# Patient Record
Sex: Male | Born: 1990 | Race: White | Hispanic: No | Marital: Single | Smoking: Former smoker
Health system: Southern US, Community
[De-identification: ages and names within clinical notes are randomized; demographics above are authoritative.]

## PROBLEM LIST (undated history)

## (undated) ENCOUNTER — Ambulatory Visit (HOSPITAL_COMMUNITY)

## (undated) DIAGNOSIS — Z8619 Personal history of other infectious and parasitic diseases: Secondary | ICD-10-CM

## (undated) DIAGNOSIS — Z973 Presence of spectacles and contact lenses: Secondary | ICD-10-CM

## (undated) DIAGNOSIS — G43909 Migraine, unspecified, not intractable, without status migrainosus: Secondary | ICD-10-CM

## (undated) DIAGNOSIS — Z299 Encounter for prophylactic measures, unspecified: Secondary | ICD-10-CM

## (undated) DIAGNOSIS — L309 Dermatitis, unspecified: Secondary | ICD-10-CM

## (undated) HISTORY — DX: Migraine, unspecified, not intractable, without status migrainosus: G43.909

## (undated) HISTORY — PX: HERNIA REPAIR: SHX51

## (undated) HISTORY — DX: Encounter for prophylactic measures, unspecified: Z29.9

---

## 2015-01-26 ENCOUNTER — Encounter: Payer: Self-pay | Admitting: Gastroenterology

## 2015-02-01 ENCOUNTER — Encounter (HOSPITAL_COMMUNITY)
Admission: RE | Admit: 2015-02-01 | Discharge: 2015-02-01 | Disposition: A | Discharge status: Home / Self Care | Payer: Commercial Managed Care - HMO | Source: Ambulatory Visit | Attending: General Surgery | Admitting: General Surgery

## 2015-02-01 ENCOUNTER — Encounter (HOSPITAL_COMMUNITY): Payer: Self-pay

## 2015-02-01 DIAGNOSIS — Z01818 Encounter for other preprocedural examination: Secondary | ICD-10-CM | POA: Insufficient documentation

## 2015-02-01 DIAGNOSIS — B079 Viral wart, unspecified: Secondary | ICD-10-CM | POA: Diagnosis not present

## 2015-02-01 LAB — BASIC METABOLIC PANEL
Anion gap: 9 (ref 5–15)
BUN: 11 mg/dL (ref 6–20)
CHLORIDE: 101 mmol/L (ref 101–111)
CO2: 29 mmol/L (ref 22–32)
CREATININE: 0.81 mg/dL (ref 0.61–1.24)
Calcium: 9.8 mg/dL (ref 8.9–10.3)
GFR calc Af Amer: >60 mL/min (ref >60)
Glucose, Bld: 97 mg/dL (ref 65–99)
Potassium: 4.4 mmol/L (ref 3.5–5.1)
Sodium: 139 mmol/L (ref 135–145)

## 2015-02-01 LAB — CBC
HEMATOCRIT: 43.8 % (ref 39.0–52.0)
HEMOGLOBIN: 15.5 g/dL (ref 13.0–17.0)
MCH: 30.3 pg (ref 26.0–34.0)
MCHC: 35.4 g/dL (ref 30.0–36.0)
MCV: 85.5 fL (ref 78.0–100.0)
Platelets: 185 10*3/uL (ref 150–400)
RBC: 5.12 MIL/uL (ref 4.22–5.81)
RDW: 12.1 % (ref 11.5–15.5)
WBC: 7 10*3/uL (ref 4.0–10.5)

## 2015-02-01 MED ORDER — CHLORHEXIDINE GLUCONATE 4 % EX LIQD: 1.0000 "application " | Freq: Once | CUTANEOUS | Status: DC

## 2015-02-01 NOTE — Patient Instructions (Addendum)
Wesley Joseph  02/01/2015     @   Your procedure is scheduled on Monday, 02/07/15.  Report to [facility] at [phone] A.M.  Call this number if you have problems the morning of surgery:  [phone]   Remember:  Do not eat food or drink liquids after midnight.  Take these medicines the morning of surgery with A SIP OF WATER none   Do not wear jewelry, make-up or nail polish.  Do not wear lotions, powders, or perfumes.  You may wear deodorant.  Do not shave 48 hours prior to surgery.  Men may shave face and neck.  Do not bring valuables to the hospital.  [facility] is not responsible for any belongings or valuables.  Contacts, dentures or bridgework may not be worn into surgery.  Leave your suitcase in the car.  After surgery it may be brought to your room.  For patients admitted to the hospital, discharge time will be determined by your treatment team.  Patients discharged the day of surgery will not be allowed to drive home.   Name and phone number of your driver:   family Special instructions:  Follo0w any special instructions given to you by Dr. Lovell Sheehan or his office.  Please read over the following fact sheets that you were given. Anesthesia Post-op Instructions and Care and Recovery After Surgery      Incision and Drainage Care After Refer to this sheet in the next few weeks. These instructions provide you with information on caring for yourself after your procedure. Your caregiver may also give you more specific instructions. Your treatment has been planned according to current medical practices, but problems sometimes occur. Call your caregiver if you have any problems or questions after your procedure. HOME CARE INSTRUCTIONS   If antibiotic medicine is given, take it as directed. Finish it even if you start to feel better.  Only take over-the-counter or prescription medicines for pain, discomfort, or fever as directed by your caregiver.  Keep all  follow-up appointments as directed by your caregiver.  Change any bandages (dressings) as directed by your caregiver. Replace old dressings with clean dressings.  Wash your hands before and after caring for your wound. You will receive specific instructions for cleansing and caring for your wound.  SEEK MEDICAL CARE IF:   You have increased pain, swelling, or redness around the wound.  You have increased drainage, smell, or bleeding from the wound.  You have muscle aches, chills, or you feel generally sick.  You have a fever. MAKE SURE YOU:   Understand these instructions.  Will watch your condition.  Will get help right away if you are not doing well or get worse. Document Released: 09/24/2011 Document Reviewed: 09/24/2011 [facility] Patient Information 2015 [place], [place]. This information is not intended to replace advice given to you by your health care provider. Make sure you discuss any questions you have with your health care provider. PATIENT INSTRUCTIONS POST-ANESTHESIA  IMMEDIATELY FOLLOWING SURGERY:  Do not drive or operate machinery for the first twenty four hours after surgery.  Do not make any important decisions for twenty four hours after surgery or while taking narcotic pain medications or sedatives.  If you develop intractable nausea and vomiting or a severe headache please notify your doctor immediately.  FOLLOW-UP:  Please make an appointment with your surgeon as instructed. You do not need to follow up with anesthesia unless specifically instructed to do so.  WOUND CARE INSTRUCTIONS (if applicable):  Keep  a dry clean dressing on the anesthesia/puncture wound site if there is drainage.  Once the wound has quit draining you may leave it open to air.  Generally you should leave the bandage intact for twenty four hours unless there is drainage.  If the epidural site drains for more than 36-48 hours please call the anesthesia department.  QUESTIONS?:  Please feel free  to call your physician or the hospital operator if you have any questions, and they will be happy to assist you.

## 2015-02-01 NOTE — H&P (Signed)
  NTS SOAP Note  Vital Signs:  Vitals as of: 02/01/2015: Systolic 127: Diastolic 74: Heart Rate 73: Temp 99.26F: Height 365ft 7in: Weight 129Lbs 0 Ounces: BMI 20.2  BMI : 20.2 kg/m2  Subjective: This 24 year old male presents for of anal growths.  Noticed them several months ago.  Do get irritated with wiping.  Review of Symptoms:  Constitutional:unremarkable   Head:unremarkable Eyes:unremarkable   Nose/Mouth/Throat:unremarkable Cardiovascular:  unremarkable Respiratory:unremarkable Gastrointestinal:  unremarkable   Genitourinary:unremarkable   Musculoskeletal:unremarkable Skin:unremarkable Hematolgic/Lymphatic:unremarkable   Allergic/Immunologic:unremarkable   Past Medical History:  Reviewed  Past Medical History  Surgical History: none Medical Problems: none Allergies: nkda Medications: ezcema cream   Social History:Reviewed  Social History  Preferred Language: English Race:  White Ethnicity: Not Hispanic / Latino Age: 24 year Marital Status:  S Sexual Activity: anal sex Alcohol: socially   Smoking Status: Never smoker reviewed on 02/01/2015 Functional Status reviewed on 02/01/2015 ------------------------------------------------ Bathing: Normal Cooking: Normal Dressing: Normal Driving: Normal Eating: Normal Managing Meds: Normal Oral Care: Normal Shopping: Normal Toileting: Normal Transferring: Normal Walking: Normal Cognitive Status reviewed on 02/01/2015 ------------------------------------------------ Attention: Normal Decision Making: Normal Language: Normal Memory: Normal Motor: Normal Perception: Normal Problem Solving: Normal Visual and Spatial: Normal   Family History:Reviewed  Family Health History Mother, Living; Hypertension (high blood pressure);  Father     Objective Information: General:Well appearing, well nourished in no distress. Heart:RRR, no murmur Lungs:  CTA bilaterally, no wheezes,  rhonchi, rales.  Breathing unlabored. Perianal condylloma present.  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   Patient Education:Alternative treatments to surgery were discussed with patient (and family).  Risks and benefits  of procedure including recurrence of the condylloma were fully explained to the patient (and family) who gave informed consent. Patient/family questions were addressed.  Follow-up:Pending Surgery

## 2015-02-07 ENCOUNTER — Encounter (HOSPITAL_COMMUNITY)
Admission: RE | Disposition: A | Discharge status: Home / Self Care | Payer: Self-pay | Source: Ambulatory Visit | Attending: General Surgery

## 2015-02-07 ENCOUNTER — Ambulatory Visit (HOSPITAL_COMMUNITY)
Admission: RE | Admit: 2015-02-07 | Discharge: 2015-02-07 | Disposition: A | Discharge status: Home / Self Care | Payer: Commercial Managed Care - HMO | Source: Ambulatory Visit | Attending: General Surgery | Admitting: General Surgery

## 2015-02-07 ENCOUNTER — Ambulatory Visit (HOSPITAL_COMMUNITY): Payer: Commercial Managed Care - HMO | Admitting: Anesthesiology

## 2015-02-07 DIAGNOSIS — A630 Anogenital (venereal) warts: Secondary | ICD-10-CM | POA: Diagnosis present

## 2015-02-07 DIAGNOSIS — Z79899 Other long term (current) drug therapy: Secondary | ICD-10-CM | POA: Diagnosis not present

## 2015-02-07 HISTORY — PX: WART FULGURATION: SHX5245

## 2015-02-07 SURGERY — FULGURATION, WART, ANUS
Anesthesia: General | Site: Buttocks

## 2015-02-07 MED ORDER — MIDAZOLAM HCL 2 MG/2ML IJ SOLN
1.0000 mg | INTRAMUSCULAR | Status: DC | PRN
  Administered 2015-02-07: 2 mg via INTRAVENOUS

## 2015-02-07 MED ORDER — BUPIVACAINE HCL (PF) 0.5 % IJ SOLN
INTRAMUSCULAR | Status: AC
  Filled 2015-02-07: qty 30

## 2015-02-07 MED ORDER — HYDROCODONE-ACETAMINOPHEN 5-325 MG PO TABS
1.0000 | ORAL_TABLET | Freq: Once | ORAL | Status: AC
Start: 2015-02-07 — End: 2015-02-07
  Administered 2015-02-07: 1 via ORAL

## 2015-02-07 MED ORDER — FENTANYL CITRATE (PF) 100 MCG/2ML IJ SOLN
INTRAMUSCULAR | Status: AC
  Filled 2015-02-07: qty 2

## 2015-02-07 MED ORDER — KETOROLAC TROMETHAMINE 30 MG/ML IJ SOLN
30.0000 mg | Freq: Once | INTRAMUSCULAR | Status: AC
  Administered 2015-02-07: 30 mg via INTRAVENOUS

## 2015-02-07 MED ORDER — FENTANYL CITRATE (PF) 100 MCG/2ML IJ SOLN: 25.0000 ug | INTRAMUSCULAR | Status: DC | PRN

## 2015-02-07 MED ORDER — MIDAZOLAM HCL 2 MG/2ML IJ SOLN
INTRAMUSCULAR | Status: AC
  Filled 2015-02-07: qty 2

## 2015-02-07 MED ORDER — SILVER SULFADIAZINE 1 % EX CREA
TOPICAL_CREAM | CUTANEOUS | Status: AC
  Filled 2015-02-07: qty 50

## 2015-02-07 MED ORDER — LIDOCAINE VISCOUS 2 % MT SOLN
OROMUCOSAL | Status: AC
  Filled 2015-02-07: qty 15

## 2015-02-07 MED ORDER — HYDROCODONE-ACETAMINOPHEN 5-325 MG PO TABS: 1.0000 | ORAL_TABLET | Freq: Four times a day (QID) | ORAL | Status: AC | PRN

## 2015-02-07 MED ORDER — LIDOCAINE HCL 1 % IJ SOLN
INTRAMUSCULAR | Status: DC | PRN
  Administered 2015-02-07: 30 mg via INTRADERMAL

## 2015-02-07 MED ORDER — ONDANSETRON HCL 4 MG/2ML IJ SOLN
4.0000 mg | Freq: Once | INTRAMUSCULAR | Status: AC
  Administered 2015-02-07: 4 mg via INTRAVENOUS

## 2015-02-07 MED ORDER — HYDROCODONE-ACETAMINOPHEN 5-325 MG PO TABS
ORAL_TABLET | ORAL | Status: AC
  Filled 2015-02-07: qty 1

## 2015-02-07 MED ORDER — MIDAZOLAM HCL 5 MG/5ML IJ SOLN
INTRAMUSCULAR | Status: DC | PRN
  Administered 2015-02-07: 2 mg via INTRAVENOUS

## 2015-02-07 MED ORDER — FENTANYL CITRATE (PF) 100 MCG/2ML IJ SOLN
INTRAMUSCULAR | Status: DC | PRN
  Administered 2015-02-07 (×2): 50 ug via INTRAVENOUS

## 2015-02-07 MED ORDER — FENTANYL CITRATE (PF) 100 MCG/2ML IJ SOLN
25.0000 ug | INTRAMUSCULAR | Status: AC
  Administered 2015-02-07 (×2): 25 ug via INTRAVENOUS

## 2015-02-07 MED ORDER — PROPOFOL 10 MG/ML IV BOLUS
INTRAVENOUS | Status: DC | PRN
  Administered 2015-02-07: 150 mg via INTRAVENOUS
  Administered 2015-02-07: 20 mg via INTRAVENOUS

## 2015-02-07 MED ORDER — ONDANSETRON HCL 4 MG/2ML IJ SOLN
INTRAMUSCULAR | Status: AC
  Filled 2015-02-07: qty 2

## 2015-02-07 MED ORDER — SILVER SULFADIAZINE 1 % EX CREA
TOPICAL_CREAM | CUTANEOUS | Status: DC | PRN
  Administered 2015-02-07: 1 via TOPICAL

## 2015-02-07 MED ORDER — PROPOFOL 10 MG/ML IV BOLUS
INTRAVENOUS | Status: AC
Start: 2015-02-07 — End: 2015-02-07
  Filled 2015-02-07: qty 20

## 2015-02-07 MED ORDER — BUPIVACAINE HCL (PF) 0.5 % IJ SOLN
INTRAMUSCULAR | Status: DC | PRN
  Administered 2015-02-07: 8 mL

## 2015-02-07 MED ORDER — KETOROLAC TROMETHAMINE 30 MG/ML IJ SOLN
INTRAMUSCULAR | Status: AC
Start: 2015-02-07 — End: 2015-02-07
  Filled 2015-02-07: qty 1

## 2015-02-07 MED ORDER — ONDANSETRON HCL 4 MG/2ML IJ SOLN: 4.0000 mg | Freq: Once | INTRAMUSCULAR | Status: AC | PRN

## 2015-02-07 MED ORDER — 0.9 % SODIUM CHLORIDE (POUR BTL) OPTIME
TOPICAL | Status: DC | PRN
  Administered 2015-02-07: 500 mL

## 2015-02-07 MED ORDER — LACTATED RINGERS IV SOLN
INTRAVENOUS | Status: DC
  Administered 2015-02-07: 1000 mL via INTRAVENOUS

## 2015-02-07 SURGICAL SUPPLY — 31 items
BAG HAMPER (MISCELLANEOUS) ×2 IMPLANT
BLADE 15 SAFETY STRL DISP (BLADE) ×2 IMPLANT
CLOTH BEACON ORANGE TIMEOUT ST (SAFETY) ×2 IMPLANT
COVER LIGHT HANDLE STERIS (MISCELLANEOUS) ×4 IMPLANT
DECANTER SPIKE VIAL GLASS SM (MISCELLANEOUS) ×2 IMPLANT
ELECT REM PT RETURN 9FT ADLT (ELECTROSURGICAL) ×2
ELECTRODE REM PT RTRN 9FT ADLT (ELECTROSURGICAL) ×1 IMPLANT
FACESHIELD STD STERILE (MASK) ×2 IMPLANT
FORMALIN 10 PREFIL 120ML (MISCELLANEOUS) ×2 IMPLANT
GAUZE SPONGE 4X4 16PLY XRAY LF (GAUZE/BANDAGES/DRESSINGS) ×2 IMPLANT
GLOVE INDICATOR 7.0 STRL GRN (GLOVE) ×2 IMPLANT
GLOVE SURG SS PI 7.5 STRL IVOR (GLOVE) ×4 IMPLANT
GOWN STRL REUS W/TWL LRG LVL3 (GOWN DISPOSABLE) ×4 IMPLANT
HEMOSTAT SURGICEL 4X8 (HEMOSTASIS) ×2 IMPLANT
KIT ROOM TURNOVER APOR (KITS) ×2 IMPLANT
LASER FIBER DISP (UROLOGICAL SUPPLIES) IMPLANT
LASER FIBER DISP 1000U (UROLOGICAL SUPPLIES) IMPLANT
LIGASURE IMPACT 36 18CM CVD LR (INSTRUMENTS) ×2 IMPLANT
MANIFOLD NEPTUNE II (INSTRUMENTS) ×2 IMPLANT
NEEDLE HYPO 25X1 1.5 SAFETY (NEEDLE) ×2 IMPLANT
PACK PERI GYN (CUSTOM PROCEDURE TRAY) ×2 IMPLANT
PAD ARMBOARD 7.5X6 YLW CONV (MISCELLANEOUS) ×2 IMPLANT
PREFILTER SMOKE EVAC (FILTER) ×2 IMPLANT
SET BASIN LINEN APH (SET/KITS/TRAYS/PACK) ×2 IMPLANT
SUT SILK 0 FSL (SUTURE) ×2 IMPLANT
SYR BULB IRRIGATION 50ML (SYRINGE) ×2 IMPLANT
SYRINGE 10CC LL (SYRINGE) ×2 IMPLANT
TOWEL OR 17X26 4PK STRL BLUE (TOWEL DISPOSABLE) ×2 IMPLANT
TUBING SMOKE EVAC (TUBING) ×2 IMPLANT
WATER STERILE IRR 1000ML POUR (IV SOLUTION) ×2 IMPLANT
YANKAUER SUCT BULB TIP 10FT TU (MISCELLANEOUS) ×2 IMPLANT

## 2015-02-07 NOTE — Anesthesia Postprocedure Evaluation (Signed)
  Anesthesia Post-op Note  Patient: Wesley Joseph  Procedure(s) Performed: Procedure(s): FULGURATION ANAL CONDYLOMA (N/A)  Patient Location: PACU  Anesthesia Type:General  Level of Consciousness: awake, alert , oriented and patient cooperative  Airway and Oxygen Therapy: Patient Spontanous Breathing  Post-op Pain: 2 /10, mild  Post-op Assessment: Post-op Vital signs reviewed, Patient's Cardiovascular Status Stable, Respiratory Function Stable, Patent Airway, No signs of Nausea or vomiting, Adequate PO intake, Pain level controlled and No headache              Post-op Vital Signs: Reviewed and stable  Last Vitals:  Filed Vitals:   02/07/15 0908  BP: 94/68  Pulse:   Temp: 36.7 C  Resp: 15    Complications: No apparent anesthesia complications

## 2015-02-07 NOTE — Discharge Instructions (Signed)
Human Papillomavirus Human papillomavirus (HPV) is the most common sexually transmitted infection (STI) and is highly contagious. HPV infections cause genital warts and cancers to the outlet of the womb (cervix), birth canal (vagina), opening of the birth canal (vulva), and anus. There are over 100 types of HPV. Four types of HPV are responsible for causing 70% of all cervical cancers. Ninety percent of anal cancers and genital warts are caused by HPV. Unless you have wart-like lesions in the throat or genital warts that you can see or feel, HPV usually does not cause symptoms. Therefore, people can be infected for long periods and pass it on to others without knowing it. HPV in pregnancy usually does not cause a problem for the mother or baby. If the mother has genital warts, the baby rarely gets infected. When the HPV infection is found to be pre-cancerous on the cervix, vagina, or vulva, the mother will be followed closely during the pregnancy. Any needed treatment will be done after the baby is born. CAUSES   Having unprotected sex. HPV can be spread by oral, vaginal, or anal sexual activity.  Having several sex partners.  Having a sex partner who has other sex partners.  Having or having had another sexually transmitted infection. SYMPTOMS   More than 90% of people carrying HPV cannot tell anything is wrong.  Wart-like lesions in the throat (from having oral sex).  Warts in the infected skin or mucous membranes.  Genital warts may itch, burn, or bleed.  Genital warts may be painful or bleed during sexual intercourse. DIAGNOSIS   Genital warts are easily seen with the naked eye.  Currently, there is no FDA-approved test to detect HPV in males.  In females, a Pap test can show cells which are infected with HPV.  In females, a scope can be used to view the cervix (colposcopy). A colposcopy can be performed if the pelvic exam or Pap test is abnormal.  In females, a sample of tissue  may be removed (biopsy) during the colposcopy. TREATMENT   Treatment of genital warts can include:  Podophyllin lotion or gel.  Bichloroacetic acid (BCA) or trichloroacetic acid (TCA).  Podofilox solution or gel.  Imiquimod cream.  Interferon injections.  Use of a probe to apply extreme cold (cryotherapy).  Application of an intense beam of light (laser treatment).  Use of a probe to apply extreme heat (electrocautery).  Surgery.  HPV of the cervix, vagina, or vulva can be treated with:  Cryotherapy.  Laser treatment.  Electrocautery.  Surgery. Your caregiver will follow you closely after you are treated. This is because the HPV can come back and may need treatment again. HOME CARE INSTRUCTIONS   Follow your caregiver's instructions regarding medications, Pap tests, and follow-up exams.  Do not touch or scratch the warts.  Do not treat genital warts with medication used for treating hand warts.  Tell your sex partner about your infection because he or she may also need treatment.  Do not have sex while you are being treated.  After treatment, use condoms during sex to prevent future infections.  Have only 1 sex partner.  Have a sex partner who does not have other sex partners.  Use over-the-counter creams for itching or irritation as directed by your caregiver.  Use over-the-counter or prescription medicines for pain, discomfort, or fever as directed by your caregiver.  Do not douche or use tampons during treatment of HPV. PREVENTION   Talk to your caregiver about getting the HPV   vaccines. These vaccines prevent some HPV infections and cancers. It is recommended that the vaccine be given to males and females between the age of 9 and 26 years old. It will not work if you already have HPV and it is not recommended for pregnant women. The vaccines are not recommended for pregnant women.  Call your caregiver if you think you are pregnant and have the HPV.  A  PAP test is done to screen for cervical cancer.  The first PAP test should be done at age 21.  Between ages 21 and 29, PAP tests are repeated every 2 years.  Beginning at age 30, you are advised to have a PAP test every 3 years as long as your past 3 PAP tests have been normal.  Some women have medical problems that increase the chance of getting cervical cancer. Talk to your caregiver about these problems. It is especially important to talk to your caregiver if a new problem develops soon after your last PAP test. In these cases, your caregiver may recommend more frequent screening and Pap tests.  The above recommendations are the same for women who have or have not gotten the vaccine for HPV (Human Papillomavirus).  If you had a hysterectomy for a problem that was not a cancer or a condition that could lead to cancer, then you no longer need Pap tests. However, even if you no longer need a PAP test, a regular exam is a good idea to make sure no other problems are starting.   If you are between ages 65 and 70, and you have had normal Pap tests going back 10 years, you no longer need Pap tests. However, even if you no longer need a PAP test, a regular exam is a good idea to make sure no other problems are starting.  If you have had past treatment for cervical cancer or a condition that could lead to cancer, you need Pap tests and screening for cancer for at least 20 years after your treatment.  If Pap tests have been discontinued, risk factors (such as a new sexual partner)need to be re-assessed to determine if screening should be resumed.  Some women may need screenings more often if they are at high risk for cervical cancer. SEEK MEDICAL CARE IF:   The treated skin becomes red, swollen or painful.  You have an oral temperature above 102 F (38.9 C).  You feel generally ill.  You feel lumps or pimple-like projections in and around your genital area.  You develop bleeding of the  vagina or the treatment area.  You develop painful sexual intercourse. Document Released: 09/22/2003 Document Revised: 09/24/2011 Document Reviewed: 10/07/2013 [employer] Patient Information 2015 [employer]. This information is not intended to replace advice given to you by your health care provider. Make sure you discuss any questions you have with your health care provider.  

## 2015-02-07 NOTE — Op Note (Signed)
Patient:  Wesley Joseph  DOB:  1991-06-06  MRN:  [id number]   Preop Diagnosis:  Anal condyloma  Postop Diagnosis:  Same  Procedure:  Excision and fulguration of anal condyloma  Surgeon:  Franky Macho, M.D.  Anes:  Gen.  Indications:  Patient is a 24 year old white male who presents with anal condyloma. The risks and benefits of the procedure including bleeding, infection, and recurrence of the condyloma were fully explained to the patient, who gave informed consent.  Procedure note:  The patient was placed in the lithotomy position after general anesthesia was administered. The perineum was prepped and draped using the usual sterile technique with Betadine. Surgical site confirmation was performed.  The patient had multiple condyloma present both externally and a small amount in the anal canal. A large one was removed using the LigaSure. The rest were fulgurated using cautery. 0.5% Sensorcaine was instilled the surrounding perineum. Surgicel and Viscous Xylocaine rectal packing was then placed.  All tape and needle counts were correct at the end of the procedure. The patient was awakened and transferred to PACU in stable condition.  Complications:  None  EBL:  Minimal  Specimen:  Anal condyloma

## 2015-02-07 NOTE — Anesthesia Preprocedure Evaluation (Signed)
Anesthesia Evaluation  Patient identified by MRN, date of birth, ID band Patient awake    Reviewed: Allergy & Precautions, NPO status , Patient's Chart, lab work & pertinent test results  Airway Mallampati: II  TM Distance: >3 FB Neck ROM: Full    Dental  (+) Teeth Intact   Pulmonary neg pulmonary ROS,  breath sounds clear to auscultation        Cardiovascular negative cardio ROS  Rhythm:Regular Rate:Normal     Neuro/Psych negative psych ROS   GI/Hepatic negative GI ROS,   Endo/Other    Renal/GU      Musculoskeletal   Abdominal   Peds  Hematology   Anesthesia Other Findings   Reproductive/Obstetrics                             Anesthesia Physical Anesthesia Plan  ASA: I  Anesthesia Plan: General   Post-op Pain Management:    Induction: Intravenous  Airway Management Planned: LMA  Additional Equipment:   Intra-op Plan:   Post-operative Plan: Extubation in OR  Informed Consent: I have reviewed the patients History and Physical, chart, labs and discussed the procedure including the risks, benefits and alternatives for the proposed anesthesia with the patient or authorized representative who has indicated his/her understanding and acceptance.     Plan Discussed with:   Anesthesia Plan Comments:         Anesthesia Quick Evaluation

## 2015-02-07 NOTE — Anesthesia Procedure Notes (Signed)
Procedure Name: LMA Insertion Date/Time: 02/07/2015 8:18 AM Performed by: Despina Hidden Pre-anesthesia Checklist: Patient identified, Emergency Drugs available, Suction available and Patient being monitored Patient Re-evaluated:Patient Re-evaluated prior to inductionOxygen Delivery Method: Circle system utilized Preoxygenation: Pre-oxygenation with 100% oxygen Intubation Type: IV induction Ventilation: Mask ventilation without difficulty LMA: LMA flexible inserted LMA Size: 4.0 Grade View: Grade II Tube type: Oral Number of attempts: 1 Placement Confirmation: positive ETCO2 and breath sounds checked- equal and bilateral Tube secured with: Tape Dental Injury: Teeth and Oropharynx as per pre-operative assessment

## 2015-02-07 NOTE — Interval H&P Note (Signed)
History and Physical Interval Note:  02/07/2015 8:03 AM  Wesley Joseph  has presented today for surgery, with the diagnosis of anal wart  The various methods of treatment have been discussed with the patient and family. After consideration of risks, benefits and other options for treatment, the patient has consented to  Procedure(s): FULGURATION ANAL CONDYLOMA (N/A) as a surgical intervention .  The patient's history has been reviewed, patient examined, no change in status, stable for surgery.  I have reviewed the patient's chart and labs.  Questions were answered to the patient's satisfaction.     Franky Macho A

## 2015-02-07 NOTE — Progress Notes (Signed)
Awake. C/O postop rectal pain.  Saltines and graham crackers given to eat before norco tablet given. Tolerated well.

## 2015-02-07 NOTE — Transfer of Care (Signed)
Immediate Anesthesia Transfer of Care Note  Patient: Wesley Joseph  Procedure(s) Performed: Procedure(s): FULGURATION ANAL CONDYLOMA (N/A)  Patient Location: PACU  Anesthesia Type:General  Level of Consciousness: awake and patient cooperative  Airway & Oxygen Therapy: Patient Spontanous Breathing and Patient connected to face mask oxygen  Post-op Assessment: Report given to RN, Post -op Vital signs reviewed and stable and Patient moving all extremities  Post vital signs: Reviewed and stable  Last Vitals:  Filed Vitals:   02/07/15 0730  BP: 122/77  Pulse:   Temp:   Resp: 14    Complications: No apparent anesthesia complications

## 2015-02-08 ENCOUNTER — Encounter (HOSPITAL_COMMUNITY): Payer: Self-pay | Admitting: General Surgery

## 2015-04-05 ENCOUNTER — Ambulatory Visit: Payer: Self-pay | Admitting: Gastroenterology

## 2015-04-12 ENCOUNTER — Ambulatory Visit: Payer: Self-pay | Admitting: Gastroenterology

## 2016-12-17 ENCOUNTER — Ambulatory Visit (INDEPENDENT_AMBULATORY_CARE_PROVIDER_SITE_OTHER): Payer: Commercial Managed Care - HMO | Admitting: Medical

## 2016-12-17 ENCOUNTER — Encounter: Payer: Self-pay | Admitting: Medical

## 2016-12-17 VITALS — BP 128/72 | HR 79 | Ht 66.5 in | Wt 139.4 lb

## 2016-12-17 DIAGNOSIS — Z113 Encounter for screening for infections with a predominantly sexual mode of transmission: Secondary | ICD-10-CM

## 2016-12-17 DIAGNOSIS — Z7251 High risk heterosexual behavior: Secondary | ICD-10-CM

## 2016-12-17 DIAGNOSIS — Z299 Encounter for prophylactic measures, unspecified: Secondary | ICD-10-CM

## 2016-12-17 DIAGNOSIS — Z23 Encounter for immunization: Secondary | ICD-10-CM

## 2016-12-17 NOTE — Addendum Note (Signed)
Addended by: Jac CanavanYSINGER, Brecklyn Galvis S on: 12/17/2016 08:52 PM   Modules accepted: Level of Service

## 2016-12-17 NOTE — Progress Notes (Signed)
Subjective:  Wesley Joseph is a 26 y.o. male who presents as a new patient.   Was [facility] [place]  Was having difficulty getting in for appt.     He is interested in PreP medication.  He is a gay male.  recently had unprotected sex but found out his partner wasn't been as faithful as him.    He does note hx/o Herpes and prior anal warts that were excised.   His last STD testing was 48mo ago.   Works in [employer].   He has no current symptoms.  He does desire HPV vaccination as well.  No other aggravating or relieving factors.    No other c/o.  The following portions of the patient's history were reviewed and updated as appropriate: allergies, current medications, past family history, past medical history, past social history, past surgical history and problem list.  ROS Otherwise as in subjective above    Objective: BP 128/72   Pulse 79   Ht 5' 6.5" (1.689 m)   Wt 139 lb 6.4 oz (63.2 kg)   SpO2 97%   BMI 22.16 kg/m   General appearance: alert, no distress, WD/WN Neck: supple, no lymphadenopathy, no thyromegaly, no masses Heart: RRR, normal S1, S2, no murmurs Lungs: CTA bilaterally, no wheezes, rhonchi, or rales Pulses: 2+ radial pulses, 2+ pedal pulses, normal cap refill Ext: no edema GU: normal male external genitalia, circumcised, nontender, no masses, no hernia, no lymphadenopathy Rectal: deferred    Assessment: Encounter Diagnoses  Name Primary?  . Prophylactic measure Yes  . High risk sexual behavior   . Screen for STD (sexually transmitted disease)   . Need for HPV vaccination      Plan: Discussed their concern for PreP/Truvada prophylaxis medication.   Counseled on safety risks and indication for PreP, counseled on importance of f/u and regular STD screening q2-4350mo, counseled on discontinuing Truvada if they seroconvert to HIV, counseled on importance of daily dosing, counseled on Truvada as a part of a comprehensive prevention  strategy, discussed safe sex, condom use.   Gave handouts on the medication.  Gave handout and drug information.  They signed the PreP agreement form and voiced understanding and agreement with plan.   Counseled on the Human Papilloma virus vaccine.  Vaccine information sheet given.  HPV vaccine given after consent obtained.  Patient was advised to return in 2 months for HPV #2, and in 6 months for HPV #3.    Reviewed his [facility] vaccine records.    Wesley Joseph was seen today for new patient (initial visit).  Diagnoses and all orders for this visit:  Prophylactic measure -     HIV antibody -     GC/Chlamydia Probe Amp -     RPR -     Hepatitis C antibody -     Hepatitis B surface antigen -     Cancel: Hepatitis B surface antibody  High risk sexual behavior -     HIV antibody -     GC/Chlamydia Probe Amp -     RPR -     Hepatitis C antibody -     Hepatitis B surface antigen -     Cancel: Hepatitis B surface antibody  Screen for STD (sexually transmitted disease) -     HIV antibody -     GC/Chlamydia Probe Amp -     RPR -     Hepatitis C antibody -     Hepatitis B surface  antigen -     Cancel: Hepatitis B surface antibody  Need for HPV vaccination -     HPV 9-valent vaccine,Recombinat

## 2016-12-18 ENCOUNTER — Other Ambulatory Visit: Payer: Self-pay | Admitting: Medical

## 2016-12-18 LAB — HEPATITIS C ANTIBODY: HCV Ab: NEGATIVE

## 2016-12-18 LAB — GC/CHLAMYDIA PROBE AMP
CT PROBE, AMP APTIMA: NOT DETECTED
GC Probe RNA: NOT DETECTED

## 2016-12-18 LAB — HIV ANTIBODY (ROUTINE TESTING W REFLEX): HIV: NONREACTIVE

## 2016-12-18 LAB — RPR

## 2016-12-18 LAB — HEPATITIS B SURFACE ANTIGEN: Hepatitis B Surface Ag: NEGATIVE

## 2016-12-18 MED ORDER — EMTRICITABINE-TENOFOVIR DF 200-300 MG PO TABS: 1.0000 | ORAL_TABLET | Freq: Every day | ORAL | 0 refills | Status: DC

## 2016-12-24 ENCOUNTER — Telehealth: Payer: Self-pay

## 2016-12-24 NOTE — Telephone Encounter (Signed)
Records placed in your folder from [facility] for review. Trixie Rude/RLB

## 2017-01-07 ENCOUNTER — Encounter: Payer: Self-pay | Admitting: Medical

## 2017-03-20 ENCOUNTER — Encounter: Payer: Self-pay | Admitting: Medical

## 2017-03-20 ENCOUNTER — Ambulatory Visit (INDEPENDENT_AMBULATORY_CARE_PROVIDER_SITE_OTHER): Payer: 59 | Admitting: Medical

## 2017-03-20 VITALS — BP 110/72 | HR 59 | Wt 143.6 lb

## 2017-03-20 DIAGNOSIS — Z23 Encounter for immunization: Secondary | ICD-10-CM

## 2017-03-20 DIAGNOSIS — Z299 Encounter for prophylactic measures, unspecified: Secondary | ICD-10-CM

## 2017-03-20 DIAGNOSIS — Z7251 High risk heterosexual behavior: Secondary | ICD-10-CM | POA: Diagnosis not present

## 2017-03-20 DIAGNOSIS — Z113 Encounter for screening for infections with a predominantly sexual mode of transmission: Secondary | ICD-10-CM | POA: Diagnosis not present

## 2017-03-20 NOTE — Addendum Note (Signed)
Addended by: Jac CanavanYSINGER, DAVID S on: 03/20/2017 02:48 PM   Modules accepted: Orders

## 2017-03-20 NOTE — Addendum Note (Signed)
Addended by: Winn JockVALENTINE, Hawkin Charo N on: 03/20/2017 11:52 AM   Modules accepted: Orders

## 2017-03-20 NOTE — Progress Notes (Signed)
  Subjective:  Wesley Joseph is a 26 y.o. male who presents for f/u on Truvada PreP started 12/2016 at his new patient visit.   Has missed 2-3 doses max, but otherwise good about taking it daily.   No side effects reported.   Insurance covered the medications, $50 copay.  Using condoms most of the time, has had new sexual contacts since last visit.   No current symptoms.   [facility] in [place].  Was having difficulty getting in for appt.     He does note hx/o Herpes on serum testing but no prior exposure, and prior anal warts that were excised.  No other aggravating or relieving factors.    Exercising some.  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  No other c/o.  The following portions of the patient's history were reviewed and updated as appropriate: allergies, current medications, past family history, past medical history, past social history, past surgical history and problem list.  ROS Otherwise as in subjective above    Objective: BP 110/72   Pulse (!) 59   Wt 143 lb 9.6 oz (65.1 kg)   SpO2 99%   BMI 22.83 kg/m   General appearance: alert, no distress, WD/WN   Assessment: Encounter Diagnoses  Name Primary?  . Prophylactic measure Yes  . High risk sexual behavior   . Screen for STD (sexually transmitted disease)   . Need for HPV vaccination   . Need for influenza vaccination       Plan: Discussed their concern for PreP/Truvada prophylaxis medication.   Counseled on safety risks and indication for PreP, counseled on importance of f/u and regular STD screening q3620mo, counseled on discontinuing Truvada if they seroconvert to HIV, counseled on importance of daily dosing, counseled on Truvada as a part of a comprehensive prevention strategy, discussed safe sex, condom use.   Routine labs today, c/t PreP.  Counseled on the Human Papilloma virus vaccine.  Vaccine information sheet given.  HPV vaccine given after consent obtained.  Patient was advised to return 4  months for HPV #3.    Counseled on the influenza virus vaccine.  Vaccine information sheet given.  Influenza vaccine given after consent obtained.   Wesley Joseph was seen today for follow-up.  Diagnoses and all orders for this visit:  Prophylactic measure -     RPR -     HIV antibody -     GC/Chlamydia Probe Amp  High risk sexual behavior -     RPR -     HIV antibody -     GC/Chlamydia Probe Amp  Screen for STD (sexually transmitted disease) -     RPR -     HIV antibody -     GC/Chlamydia Probe Amp  Need for HPV vaccination  Need for influenza vaccination

## 2017-03-21 ENCOUNTER — Other Ambulatory Visit: Payer: Self-pay | Admitting: Medical

## 2017-03-21 LAB — C. TRACHOMATIS/N. GONORRHOEAE RNA
C. trachomatis RNA, TMA: NOT DETECTED
N. GONORRHOEAE RNA, TMA: NOT DETECTED

## 2017-03-21 LAB — RPR: RPR Ser Ql: NONREACTIVE

## 2017-03-21 LAB — HIV ANTIBODY (ROUTINE TESTING W REFLEX): HIV 1&2 Ab, 4th Generation: NONREACTIVE

## 2017-03-21 MED ORDER — EMTRICITABINE-TENOFOVIR DF 200-300 MG PO TABS: 1.0000 | ORAL_TABLET | Freq: Every day | ORAL | 0 refills | Status: DC

## 2017-06-12 ENCOUNTER — Ambulatory Visit: Payer: 59 | Admitting: Medical

## 2017-06-12 ENCOUNTER — Encounter: Payer: Self-pay | Admitting: Medical

## 2017-06-12 VITALS — BP 122/80 | HR 65 | Wt 152.0 lb

## 2017-06-12 DIAGNOSIS — Z299 Encounter for prophylactic measures, unspecified: Secondary | ICD-10-CM | POA: Diagnosis not present

## 2017-06-12 DIAGNOSIS — Z113 Encounter for screening for infections with a predominantly sexual mode of transmission: Secondary | ICD-10-CM | POA: Diagnosis not present

## 2017-06-12 DIAGNOSIS — Z7189 Other specified counseling: Secondary | ICD-10-CM | POA: Diagnosis not present

## 2017-06-12 NOTE — Progress Notes (Signed)
  Subjective:  Wesley AmorJustin B Brunke is a 26 y.o. male who presents for f/u on Truvada PreP started 12/2016 at his new patient visit.   No new partners since last visit.   Uses condoms the majority of the times.  Taking Prep every day, not missing doses.   No side effects reported.   No current symptoms.   Exercising some.  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  No other c/o.  The following portions of the patient's history were reviewed and updated as appropriate: allergies, current medications, past family history, past medical history, past social history, past surgical history and problem list.  ROS Otherwise as in subjective above    Objective: BP 122/80   Pulse 65   Wt 152 lb (68.9 kg)   SpO2 96%   BMI 24.17 kg/m   Wt Readings from Last 3 Encounters:  06/12/17 152 lb (68.9 kg)  03/20/17 143 lb 9.6 oz (65.1 kg)  12/17/16 139 lb 6.4 oz (63.2 kg)   General appearance: alert, no distress, WD/WN,  Neck: supple, no lymphadenopathy, no thyromegaly, no masses Heart: RRR, normal S1, S2, no murmurs Lungs: CTA bilaterally, no wheezes, rhonchi, or rales Abdomen: +bs, soft, non tender, non distended, no masses, no hepatomegaly, no splenomegaly Pulses: 2+ symmetric, upper and lower extremities, normal cap refill Ext: no edema    Assessment: Encounter Diagnoses  Name Primary?  . Prophylactic measure Yes  . Screen for STD (sexually transmitted disease)   . Medication care plan discussed with patient       Plan: Discussed their concern for PreP/Truvada prophylaxis medication.   Counseled on safety risks and indication for PreP, counseled on importance of f/u and regular STD screening q6895mo, counseled on discontinuing Truvada if they seroconvert to HIV, counseled on importance of daily dosing, counseled on Truvada as a part of a comprehensive prevention strategy, discussed safe sex, condom use.   Routine labs today, c/t PreP.   Jill AlexandersJustin was seen today for follow-up.  Diagnoses and all orders for  this visit:  Prophylactic measure -     HIV antibody -     RPR -     C. trachomatis/N. gonorrhoeae RNA  Screen for STD (sexually transmitted disease) -     HIV antibody -     RPR -     C. trachomatis/N. gonorrhoeae RNA  Medication care plan discussed with patient -     HIV antibody -     RPR -     C. trachomatis/N. gonorrhoeae RNA

## 2017-06-13 ENCOUNTER — Other Ambulatory Visit: Payer: Self-pay | Admitting: Medical

## 2017-06-13 LAB — RPR: RPR Ser Ql: NONREACTIVE

## 2017-06-13 LAB — HIV ANTIBODY (ROUTINE TESTING W REFLEX): HIV 1&2 Ab, 4th Generation: NONREACTIVE

## 2017-06-13 LAB — C. TRACHOMATIS/N. GONORRHOEAE RNA
C. TRACHOMATIS RNA, TMA: NOT DETECTED
N. GONORRHOEAE RNA, TMA: NOT DETECTED

## 2017-06-13 MED ORDER — EMTRICITABINE-TENOFOVIR DF 200-300 MG PO TABS: 1.0000 | ORAL_TABLET | Freq: Every day | ORAL | 0 refills | Status: DC

## 2017-06-25 ENCOUNTER — Telehealth: Payer: Self-pay | Admitting: Medical

## 2017-06-25 NOTE — Telephone Encounter (Signed)
Initiated [facility] TRUVADA, per Cover my meds P.A. Not required plan allows certain number fills at retail pharmacy.

## 2017-06-26 NOTE — Telephone Encounter (Signed)
Called [facility] t# [phone] & states had paid claim from [facility], they were able to do over ride as pt is able to reject mail order option.  Called [facility] & went thru for $50 co pay.  There is no penalty per [employer]

## 2017-06-29 ENCOUNTER — Telehealth: Payer: Self-pay | Admitting: Medical

## 2017-06-29 NOTE — Telephone Encounter (Signed)
Recv'd fax from [facility] that Truvada is on list of covered meds P.A. Not required

## 2017-07-24 ENCOUNTER — Other Ambulatory Visit (INDEPENDENT_AMBULATORY_CARE_PROVIDER_SITE_OTHER): Payer: 59

## 2017-07-24 DIAGNOSIS — Z23 Encounter for immunization: Secondary | ICD-10-CM

## 2017-09-12 ENCOUNTER — Ambulatory Visit: Payer: 59 | Admitting: Medical

## 2017-09-12 ENCOUNTER — Encounter: Payer: Self-pay | Admitting: Medical

## 2017-09-12 VITALS — BP 110/68 | HR 62 | Wt 144.8 lb

## 2017-09-12 DIAGNOSIS — Z299 Encounter for prophylactic measures, unspecified: Secondary | ICD-10-CM | POA: Diagnosis not present

## 2017-09-12 DIAGNOSIS — Z113 Encounter for screening for infections with a predominantly sexual mode of transmission: Secondary | ICD-10-CM

## 2017-09-12 NOTE — Progress Notes (Signed)
  Subjective:  Wesley Joseph is a 27 y.o. male who presents for f/u on Truvada PreP started 12/2016 at his new patient visit.   No new partners since last visit.   Uses condoms the majority of the times.  Taking Prep every day, not missing doses.   No side effects reported.   No current symptoms.   Exercising some.  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  No other c/o.  The following portions of the patient's history were reviewed and updated as appropriate: allergies, current medications, past family history, past medical history, past social history, past surgical history and problem list.  ROS Otherwise as in subjective above    Objective: BP 110/68   Pulse 62   Wt 144 lb 12.8 oz (65.7 kg)   SpO2 98%   BMI 23.02 kg/m   Wt Readings from Last 3 Encounters:  09/12/17 144 lb 12.8 oz (65.7 kg)  06/12/17 152 lb (68.9 kg)  03/20/17 143 lb 9.6 oz (65.1 kg)   General appearance: alert, no distress, WD/WN,  Neck: supple, no lymphadenopathy, no thyromegaly, no masses Heart: RRR, normal S1, S2, no murmurs Lungs: CTA bilaterally, no wheezes, rhonchi, or rales Abdomen: +bs, soft, non tender, non distended, no masses, no hepatomegaly, no splenomegaly Pulses: 2+ symmetric, upper and lower extremities, normal cap refill Ext: no edema    Assessment: Encounter Diagnoses  Name Primary?  . Prophylactic measure Yes  . Screen for STD (sexually transmitted disease)       Plan: Discussed their concern for PreP/Truvada prophylaxis medication.   Counseled on safety risks and indication for PreP, counseled on importance of f/u and regular STD screening q7817mo, counseled on Truvada as a part of a comprehensive prevention strategy, discussed safe sex, condom use.   Routine labs today, c/t PreP.  F/u sometime in the next 6 mo for physical, otherwise f/u 17mo for HIV test with nurse visit.   Wesley Joseph was seen today for follow-up.  Diagnoses and all orders for this visit:  Prophylactic measure -     HIV  antibody -     RPR -     GC/Chlamydia Probe Amp  Screen for STD (sexually transmitted disease) -     HIV antibody -     RPR -     GC/Chlamydia Probe Amp

## 2017-09-13 LAB — RPR: RPR Ser Ql: NONREACTIVE

## 2017-09-13 LAB — HIV ANTIBODY (ROUTINE TESTING W REFLEX): HIV Screen 4th Generation wRfx: NONREACTIVE

## 2017-09-14 LAB — GC/CHLAMYDIA PROBE AMP
Chlamydia trachomatis, NAA: NEGATIVE
NEISSERIA GONORRHOEAE BY PCR: NEGATIVE

## 2017-09-17 ENCOUNTER — Other Ambulatory Visit: Payer: Self-pay

## 2017-09-17 MED ORDER — EMTRICITABINE-TENOFOVIR DF 200-300 MG PO TABS: 1.0000 | ORAL_TABLET | Freq: Every day | ORAL | 0 refills | Status: DC

## 2017-10-16 ENCOUNTER — Other Ambulatory Visit: Payer: Self-pay | Admitting: Medical

## 2017-11-14 DIAGNOSIS — L700 Acne vulgaris: Secondary | ICD-10-CM | POA: Diagnosis not present

## 2018-01-08 ENCOUNTER — Ambulatory Visit: Payer: 59 | Admitting: Medical

## 2018-01-08 VITALS — BP 120/70 | HR 60 | Temp 98.1°F | Resp 16 | Ht 68.5 in | Wt 147.6 lb

## 2018-01-08 DIAGNOSIS — Z79899 Other long term (current) drug therapy: Secondary | ICD-10-CM | POA: Diagnosis not present

## 2018-01-08 DIAGNOSIS — Z299 Encounter for prophylactic measures, unspecified: Secondary | ICD-10-CM | POA: Diagnosis not present

## 2018-01-08 DIAGNOSIS — L709 Acne, unspecified: Secondary | ICD-10-CM

## 2018-01-08 DIAGNOSIS — Z113 Encounter for screening for infections with a predominantly sexual mode of transmission: Secondary | ICD-10-CM

## 2018-01-08 NOTE — Progress Notes (Signed)
  Subjective:  Wesley Joseph is a 27 y.o. male who presents for f/u on Truvada PreP started 12/2016.   + new partners since last visit.   Uses condoms the majority of the times.  Taking Prep every day, not missing doses.   No side effects reported.   No current symptoms.   Exercising some.  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  No other c/o.  The following portions of the patient's history were reviewed and updated as appropriate: allergies, current medications, past family history, past medical history, past social history, past surgical history and problem list.  ROS Otherwise as in subjective above    Objective: BP 120/70   Pulse 60   Temp 98.1 F (36.7 C) (Oral)   Resp 16   Ht 5' 8.5" (1.74 m)   Wt 147 lb 9.6 oz (67 kg)   SpO2 98%   BMI 22.12 kg/m   Wt Readings from Last 3 Encounters:  01/08/18 147 lb 9.6 oz (67 kg)  09/12/17 144 lb 12.8 oz (65.7 kg)  06/12/17 152 lb (68.9 kg)   General appearance: alert, no distress, WD/WN,  Skin: scattered healing comedones on back, some redness/inflammation of the comedones Neck: supple, no lymphadenopathy, no thyromegaly, no masses Heart: RRR, normal S1, S2, no murmurs Lungs: CTA bilaterally, no wheezes, rhonchi, or rales Pulses: 2+ symmetric, upper and lower extremities, normal cap refill Ext: no edema    Assessment: Encounter Diagnoses  Name Primary?  . High risk medication use Yes  . Screen for STD (sexually transmitted disease)   . Prophylactic measure   . Acne, unspecified acne type       Plan: Discussed their use of PreP/Truvada prophylaxis medication.   Counseled on safety risks and indication for PreP, counseled on importance of f/u and regular STD screening q10637mo, counseled on Truvada as a part of a comprehensive prevention strategy, discussed safe sex, condom use.   Routine labs today, c/t PreP.  Acne - on extended antibiotics per dermatology for back acne.   Wesley Joseph was seen today for follow-up.  Diagnoses and all  orders for this visit:  High risk medication use  Screen for STD (sexually transmitted disease) -     HIV antibody -     RPR -     GC/Chlamydia Probe Amp  Prophylactic measure -     HIV antibody -     RPR -     GC/Chlamydia Probe Amp  Acne, unspecified acne type

## 2018-01-09 ENCOUNTER — Other Ambulatory Visit: Payer: Self-pay | Admitting: Medical

## 2018-01-09 DIAGNOSIS — Z7252 High risk homosexual behavior: Secondary | ICD-10-CM

## 2018-01-09 DIAGNOSIS — Z113 Encounter for screening for infections with a predominantly sexual mode of transmission: Secondary | ICD-10-CM

## 2018-01-09 LAB — RPR: RPR Ser Ql: NONREACTIVE

## 2018-01-09 LAB — GC/CHLAMYDIA PROBE AMP
Chlamydia trachomatis, NAA: NEGATIVE
Neisseria gonorrhoeae by PCR: NEGATIVE

## 2018-01-09 LAB — HIV ANTIBODY (ROUTINE TESTING W REFLEX): HIV SCREEN 4TH GENERATION: NONREACTIVE

## 2018-01-09 MED ORDER — EMTRICITABINE-TENOFOVIR DF 200-300 MG PO TABS: 1.0000 | ORAL_TABLET | Freq: Every day | ORAL | 0 refills | Status: DC

## 2018-01-11 ENCOUNTER — Telehealth: Payer: Self-pay | Admitting: Medical

## 2018-02-09 NOTE — Telephone Encounter (Signed)
Recv'd fax Truvada is on plan's list of covered drugs no P.A. Required

## 2018-04-14 ENCOUNTER — Ambulatory Visit: Payer: 59 | Admitting: Medical

## 2018-04-14 ENCOUNTER — Encounter: Payer: Self-pay | Admitting: Medical

## 2018-04-14 VITALS — BP 110/70 | HR 74 | Temp 97.9°F | Resp 16 | Ht 68.5 in | Wt 149.0 lb

## 2018-04-14 DIAGNOSIS — Z79899 Other long term (current) drug therapy: Secondary | ICD-10-CM | POA: Insufficient documentation

## 2018-04-14 DIAGNOSIS — Z23 Encounter for immunization: Secondary | ICD-10-CM

## 2018-04-14 DIAGNOSIS — Z299 Encounter for prophylactic measures, unspecified: Secondary | ICD-10-CM

## 2018-04-14 DIAGNOSIS — Z113 Encounter for screening for infections with a predominantly sexual mode of transmission: Secondary | ICD-10-CM

## 2018-04-14 NOTE — Progress Notes (Signed)
  Subjective:  Wesley Joseph is a 27 y.o. male who presents for f/u on Truvada PreP started 12/2016.   Has had new partners since last visit.   Uses condoms the majority of the times.  Taking Prep every day, not missing doses.   No side effects reported.   No current symptoms.   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   The following portions of the patient's history were reviewed and updated as appropriate: allergies, current medications, past family history, past medical history, past social history, past surgical history and problem list.  ROS Otherwise as in subjective above    Objective: BP 110/70   Pulse 74   Temp 97.9 F (36.6 C) (Oral)   Resp 16   Ht 5' 8.5" (1.74 m)   Wt 149 lb (67.6 kg)   SpO2 98%   BMI 22.33 kg/m   Wt Readings from Last 3 Encounters:  04/14/18 149 lb (67.6 kg)  01/08/18 147 lb 9.6 oz (67 kg)  09/12/17 144 lb 12.8 oz (65.7 kg)   General appearance: alert, no distress, WD/WN,  Neck: supple, no lymphadenopathy, no thyromegaly, no masses Heart: RRR, normal S1, S2, no murmurs Lungs: CTA bilaterally, no wheezes, rhonchi, or rales Pulses: 2+ symmetric, upper and lower extremities, normal cap refill Ext: no edema    Assessment: Encounter Diagnoses  Name Primary?  . Prophylactic measure Yes  . Need for influenza vaccination   . Screen for STD (sexually transmitted disease)   . High risk medication use      Plan: Discussed their use of PreP/Truvada prophylaxis medication.   Counseled on safety risks and indication for PreP, counseled on importance of f/u and regular STD screening q59mo, counseled on Truvada as a part of a comprehensive prevention strategy, discussed safe sex, condom use.   Routine labs today, c/t PreP.  Counseled on the influenza virus vaccine.  Vaccine information sheet given.  Influenza vaccine given after consent obtained.    Wesley Joseph was seen today for follow up.  Diagnoses and all orders for this  visit:  Prophylactic measure -     Comprehensive metabolic panel -     CBC -     HIV Antibody (routine testing w rflx) -     RPR -     GC/Chlamydia Probe Amp  Need for influenza vaccination -     Flu Vaccine QUAD 6+ mos PF IM (Fluarix Quad PF)  Screen for STD (sexually transmitted disease) -     HIV Antibody (routine testing w rflx) -     RPR -     GC/Chlamydia Probe Amp  High risk medication use -     Comprehensive metabolic panel -     CBC -     HIV Antibody (routine testing w rflx) -     RPR -     GC/Chlamydia Probe Amp

## 2018-04-15 LAB — COMPREHENSIVE METABOLIC PANEL
A/G RATIO: 2.7 — AB (ref 1.2–2.2)
ALBUMIN: 4.9 g/dL (ref 3.5–5.5)
ALT: 30 IU/L (ref 0–44)
AST: 34 IU/L (ref 0–40)
Alkaline Phosphatase: 91 IU/L (ref 39–117)
BUN / CREAT RATIO: 14 (ref 9–20)
BUN: 14 mg/dL (ref 6–20)
Bilirubin Total: 0.7 mg/dL (ref 0.0–1.2)
CALCIUM: 9.1 mg/dL (ref 8.7–10.2)
CO2: 24 mmol/L (ref 20–29)
Chloride: 102 mmol/L (ref 96–106)
Creatinine, Ser: 1.02 mg/dL (ref 0.76–1.27)
GFR, EST AFRICAN AMERICAN: 117 mL/min/{1.73_m2} (ref >59)
GFR, EST NON AFRICAN AMERICAN: 101 mL/min/{1.73_m2} (ref >59)
GLOBULIN, TOTAL: 1.8 g/dL (ref 1.5–4.5)
Glucose: 89 mg/dL (ref 65–99)
POTASSIUM: 4.5 mmol/L (ref 3.5–5.2)
Sodium: 141 mmol/L (ref 134–144)
TOTAL PROTEIN: 6.7 g/dL (ref 6.0–8.5)

## 2018-04-15 LAB — RPR: RPR Ser Ql: NONREACTIVE

## 2018-04-15 LAB — CBC
HEMATOCRIT: 43.1 % (ref 37.5–51.0)
HEMOGLOBIN: 15.2 g/dL (ref 13.0–17.7)
MCH: 31.9 pg (ref 26.6–33.0)
MCHC: 35.3 g/dL (ref 31.5–35.7)
MCV: 90 fL (ref 79–97)
Platelets: 185 10*3/uL (ref 150–450)
RBC: 4.77 x10E6/uL (ref 4.14–5.80)
RDW: 13.2 % (ref 12.3–15.4)
WBC: 4.4 10*3/uL (ref 3.4–10.8)

## 2018-04-15 LAB — HIV ANTIBODY (ROUTINE TESTING W REFLEX): HIV Screen 4th Generation wRfx: NONREACTIVE

## 2018-04-16 ENCOUNTER — Other Ambulatory Visit: Payer: Self-pay | Admitting: Medical

## 2018-04-16 LAB — GC/CHLAMYDIA PROBE AMP
Chlamydia trachomatis, NAA: NEGATIVE
Neisseria gonorrhoeae by PCR: NEGATIVE

## 2018-04-16 MED ORDER — EMTRICITABINE-TENOFOVIR DF 200-300 MG PO TABS: 1.0000 | ORAL_TABLET | Freq: Every day | ORAL | 0 refills | Status: DC

## 2018-04-29 ENCOUNTER — Telehealth: Payer: Self-pay | Admitting: Medical

## 2018-04-29 NOTE — Telephone Encounter (Signed)
Received requested immunizations from [employer]. Sending back for review.

## 2018-04-29 NOTE — Telephone Encounter (Signed)
Abstracted immunizations.  

## 2018-07-21 ENCOUNTER — Encounter: Payer: Self-pay | Admitting: Medical

## 2018-07-21 ENCOUNTER — Ambulatory Visit: Payer: 59 | Admitting: Medical

## 2018-07-21 VITALS — BP 110/70 | HR 61 | Temp 98.1°F | Resp 16 | Ht 68.0 in | Wt 150.6 lb

## 2018-07-21 DIAGNOSIS — Z299 Encounter for prophylactic measures, unspecified: Secondary | ICD-10-CM

## 2018-07-21 DIAGNOSIS — Z113 Encounter for screening for infections with a predominantly sexual mode of transmission: Secondary | ICD-10-CM | POA: Diagnosis not present

## 2018-07-21 DIAGNOSIS — Z7252 High risk homosexual behavior: Secondary | ICD-10-CM | POA: Diagnosis not present

## 2018-07-21 DIAGNOSIS — Z79899 Other long term (current) drug therapy: Secondary | ICD-10-CM

## 2018-07-21 NOTE — Progress Notes (Signed)
   Subjective:  BRYON MACGILLIVRAY is a 28 y.o. male who presents for f/u on Truvada PreP started 12/2016.   Has had new partners since last visit.   Uses condoms the majority of the times.  Taking Prep every day, not missing doses.   No side effects reported.   No current symptoms.   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   The following portions of the patient's history were reviewed and updated as appropriate: allergies, current medications, past family history, past medical history, past social history, past surgical history and problem list.  ROS Otherwise as in subjective above    Objective: BP 110/70   Pulse 61   Temp 98.1 F (36.7 C) (Oral)   Resp 16   Ht 5\' 8"  (1.727 m)   Wt 150 lb 9.6 oz (68.3 kg)   SpO2 98%   BMI 22.90 kg/m   Wt Readings from Last 3 Encounters:  07/21/18 150 lb 9.6 oz (68.3 kg)  04/14/18 149 lb (67.6 kg)  01/08/18 147 lb 9.6 oz (67 kg)   General appearance: alert, no distress, WD/WN,    Assessment: Encounter Diagnoses  Name Primary?  . High risk medication use Yes  . High risk homosexual behavior   . Prophylactic measure   . Screen for STD (sexually transmitted disease)      Plan: Discussed their use of PreP/Truvada prophylaxis medication.   Counseled on safety risks and indication for PreP, counseled on importance of f/u and regular STD screening q45mo, counseled on Truvada as a part of a comprehensive prevention strategy, discussed safe sex, condom use.   Routine labs today, c/t PreP.   Branon was seen today for follow up.  Diagnoses and all orders for this visit:  High risk medication use -     HIV Antibody (routine testing w rflx) -     RPR -     GC/Chlamydia Probe Amp  High risk homosexual behavior -     HIV Antibody (routine testing w rflx) -     RPR -     GC/Chlamydia Probe Amp  Prophylactic measure -     HIV Antibody (routine testing w rflx) -     RPR -     GC/Chlamydia Probe Amp  Screen for STD  (sexually transmitted disease) -     HIV Antibody (routine testing w rflx) -     RPR -     GC/Chlamydia Probe Amp

## 2018-07-22 ENCOUNTER — Other Ambulatory Visit: Payer: Self-pay | Admitting: Medical

## 2018-07-22 LAB — HIV ANTIBODY (ROUTINE TESTING W REFLEX): HIV SCREEN 4TH GENERATION: NONREACTIVE

## 2018-07-22 LAB — RPR: RPR: NONREACTIVE

## 2018-07-22 MED ORDER — EMTRICITABINE-TENOFOVIR DF 200-300 MG PO TABS: 1.0000 | ORAL_TABLET | Freq: Every day | ORAL | 0 refills | Status: DC

## 2018-07-23 LAB — GC/CHLAMYDIA PROBE AMP
CHLAMYDIA, DNA PROBE: NEGATIVE
Neisseria gonorrhoeae by PCR: NEGATIVE

## 2018-10-17 ENCOUNTER — Other Ambulatory Visit: Payer: Self-pay | Admitting: Medical

## 2018-10-17 DIAGNOSIS — Z113 Encounter for screening for infections with a predominantly sexual mode of transmission: Secondary | ICD-10-CM

## 2018-10-17 DIAGNOSIS — Z79899 Other long term (current) drug therapy: Secondary | ICD-10-CM

## 2018-10-20 ENCOUNTER — Other Ambulatory Visit: Payer: Self-pay

## 2018-10-20 ENCOUNTER — Other Ambulatory Visit: Payer: 59

## 2018-10-20 DIAGNOSIS — Z79899 Other long term (current) drug therapy: Secondary | ICD-10-CM | POA: Diagnosis not present

## 2018-10-20 DIAGNOSIS — Z113 Encounter for screening for infections with a predominantly sexual mode of transmission: Secondary | ICD-10-CM

## 2018-10-21 LAB — HIV ANTIBODY (ROUTINE TESTING W REFLEX): HIV Screen 4th Generation wRfx: NONREACTIVE

## 2018-10-21 LAB — RPR: RPR Ser Ql: NONREACTIVE

## 2018-10-27 LAB — GC/CHLAMYDIA PROBE AMP
Chlamydia trachomatis, NAA: NEGATIVE
Neisseria Gonorrhoeae by PCR: NEGATIVE

## 2018-10-28 ENCOUNTER — Other Ambulatory Visit: Payer: Self-pay | Admitting: Medical

## 2018-10-28 MED ORDER — EMTRICITABINE-TENOFOVIR DF 200-300 MG PO TABS: 1.0000 | ORAL_TABLET | Freq: Every day | ORAL | 0 refills | Status: DC

## 2019-10-26 ENCOUNTER — Other Ambulatory Visit: Payer: Self-pay | Admitting: Medical

## 2019-10-26 ENCOUNTER — Other Ambulatory Visit: Payer: Self-pay

## 2019-10-26 ENCOUNTER — Encounter: Payer: Self-pay | Admitting: Medical

## 2019-10-26 ENCOUNTER — Ambulatory Visit: Payer: 59 | Admitting: Medical

## 2019-10-26 VITALS — BP 118/60 | HR 81 | Temp 98.7°F | Ht 66.0 in | Wt 152.8 lb

## 2019-10-26 DIAGNOSIS — Z299 Encounter for prophylactic measures, unspecified: Secondary | ICD-10-CM

## 2019-10-26 DIAGNOSIS — Z79899 Other long term (current) drug therapy: Secondary | ICD-10-CM | POA: Diagnosis not present

## 2019-10-26 DIAGNOSIS — Z113 Encounter for screening for infections with a predominantly sexual mode of transmission: Secondary | ICD-10-CM | POA: Diagnosis not present

## 2019-10-26 MED ORDER — DESCOVY 200-25 MG PO TABS: 1.0000 | ORAL_TABLET | Freq: Every day | ORAL | 0 refills | Status: DC

## 2019-10-26 NOTE — Progress Notes (Signed)
   Subjective:  Wesley Joseph is a 29 y.o. male who presents for f/u on Truvada PreP started 12/2016.   He was lost to follow up some this past year due to covid.  Uses condoms the majority of the times.  No current symptoms.   Exercising some.  Working in [employer].  Due to insurance change, needs to switch from Truvada to Descovy.  No prior adverse effects with truvada.   He has had 1 pfizer covid vaccine and will get the other this week.  No other c/o.   The following portions of the patient's history were reviewed and updated as appropriate: allergies, current medications, past family history, past medical history, past social history, past surgical history and problem list.  ROS Otherwise as in subjective above    Objective: BP 118/60   Pulse 81   Temp 98.7 F (37.1 C)   Ht 5\' 6"  (1.676 m)   Wt 152 lb 12.8 oz (69.3 kg)   SpO2 97%   BMI 24.66 kg/m   Wt Readings from Last 3 Encounters:  10/26/19 152 lb 12.8 oz (69.3 kg)  07/21/18 150 lb 9.6 oz (68.3 kg)  04/14/18 149 lb (67.6 kg)   General appearance: alert, no distress, WD/WN,  Lungs clear Heart rrr, normal s1, s2, no murmurs    Assessment: Encounter Diagnoses  Name Primary?  . Prophylactic measure Yes  . Screen for STD (sexually transmitted disease)   . High risk medication use      Plan: Discussed their use of PreP/Descovy prophylaxis medication.   Counseled on safety risks and indication for PreP, counseled on importance of f/u and regular STD screening q51mo, counseled on Descovy as a part of a comprehensive prevention strategy, discussed safe sex, condom use.   Routine labs today, c/t PreP.  F/u 53mo for fasting physical   Wesley Joseph was seen today for follow-up.  Diagnoses and all orders for this visit:  Prophylactic measure -     HIV Antibody (routine testing w rflx); Future -     RPR; Future -     GC/Chlamydia Probe Amp; Future  Screen for STD (sexually transmitted disease) -     HIV  Antibody (routine testing w rflx); Future -     RPR; Future -     GC/Chlamydia Probe Amp; Future  High risk medication use -     HIV Antibody (routine testing w rflx); Future -     RPR; Future -     GC/Chlamydia Probe Amp; Future  Other orders -     emtricitabine-tenofovir AF (DESCOVY) 200-25 MG tablet; Take 1 tablet by mouth daily.

## 2019-10-27 LAB — GC/CHLAMYDIA PROBE AMP
Chlamydia trachomatis, NAA: NEGATIVE
Neisseria Gonorrhoeae by PCR: NEGATIVE

## 2019-10-27 LAB — RPR QUALITATIVE: RPR Ser Ql: NONREACTIVE

## 2019-10-27 LAB — HIV ANTIBODY (ROUTINE TESTING W REFLEX): HIV Screen 4th Generation wRfx: NONREACTIVE

## 2019-10-28 ENCOUNTER — Other Ambulatory Visit: Payer: Self-pay | Admitting: Medical

## 2019-10-28 ENCOUNTER — Telehealth: Payer: Self-pay

## 2019-10-28 NOTE — Telephone Encounter (Signed)
Pt called about his med that is needing a PA. Check with Adam and we do have a one and he will work on it. Pt was advised that he will hear form our office or pharmacy confirming if he was approved our will need a new script. [facility] 10-28-19

## 2019-10-29 ENCOUNTER — Telehealth: Payer: Self-pay

## 2019-10-29 NOTE — Telephone Encounter (Signed)
I received a fax from [facility] stating they denied the PA I submitted for Descovy. The pt. Called stating that he called the insurance company and they told him that the generic for Truvada should be covered by his insurance, so you may want to change the prescription to that.

## 2019-10-30 ENCOUNTER — Other Ambulatory Visit: Payer: Self-pay | Admitting: Medical

## 2019-10-30 MED ORDER — EMTRICITABINE-TENOFOVIR DF 200-300 MG PO TABS: 1.0000 | ORAL_TABLET | Freq: Every day | ORAL | 0 refills | Status: DC

## 2019-10-30 NOTE — Telephone Encounter (Signed)
Pt. Aware that Truvada is at the pharmacy and he said he will pick it up Monday.

## 2019-10-30 NOTE — Telephone Encounter (Signed)
I sent the Truvada, so lets see if this is covered.  He was on this before anyways.

## 2020-01-26 ENCOUNTER — Other Ambulatory Visit: Payer: Self-pay

## 2020-01-26 ENCOUNTER — Encounter: Payer: Self-pay | Admitting: Medical

## 2020-01-26 ENCOUNTER — Ambulatory Visit: Payer: 59 | Admitting: Medical

## 2020-01-26 VITALS — BP 116/70 | HR 82 | Ht 66.0 in | Wt 142.8 lb

## 2020-01-26 DIAGNOSIS — Z113 Encounter for screening for infections with a predominantly sexual mode of transmission: Secondary | ICD-10-CM | POA: Insufficient documentation

## 2020-01-26 DIAGNOSIS — Z13220 Encounter for screening for lipoid disorders: Secondary | ICD-10-CM

## 2020-01-26 DIAGNOSIS — Z0000 Encounter for general adult medical examination without abnormal findings: Secondary | ICD-10-CM

## 2020-01-26 DIAGNOSIS — Z7189 Other specified counseling: Secondary | ICD-10-CM

## 2020-01-26 DIAGNOSIS — Z79899 Other long term (current) drug therapy: Secondary | ICD-10-CM

## 2020-01-26 DIAGNOSIS — Z7185 Encounter for immunization safety counseling: Secondary | ICD-10-CM | POA: Insufficient documentation

## 2020-01-26 DIAGNOSIS — Z299 Encounter for prophylactic measures, unspecified: Secondary | ICD-10-CM

## 2020-01-26 NOTE — Progress Notes (Signed)
Subjective:   HPI  Wesley Joseph is a 29 y.o. male who presents for Chief Complaint  Patient presents with  . Annual Exam    with non fasting labs-wants blood drawn for prep     Patient Care Team: Wesley Joseph, Wesley Balo, PA-C as PCP - General (Family Medicine) Sees dentist Sees eye doctor  Concerns: Needs refill on Truvada Prep.  He has had new sexual partners since last visit.  Using condoms.   Reviewed their medical, surgical, family, social, medication, and allergy history and updated chart as appropriate.  No past medical history on file.  Past Surgical History:  Procedure Laterality Date  . HERNIA REPAIR  1992  . WART FULGURATION N/A 02/07/2015   Procedure: FULGURATION ANAL CONDYLOMA;  Surgeon: Wesley Macho, MD;  Location: [facility];  Service: General;  Laterality: N/A;     Family History  Problem Relation Age of Onset  . Hypertension Mother   . Alzheimer's disease Maternal Grandmother   . Diabetes Maternal Grandfather   . Diabetes Paternal Grandfather   . Heart disease Neg Hx   . Cancer Neg Hx   . Stroke Neg Hx      Current Outpatient Medications:  .  ampicillin (PRINCIPEN) 500 MG capsule, Take 500 mg by mouth daily., Disp: , Rfl: 5 .  emtricitabine-tenofovir (TRUVADA) 200-300 MG tablet, Take 1 tablet by mouth daily. (Patient not taking: Reported on 01/26/2020), Disp: 90 tablet, Rfl: 0 .  Inulin (FIBER CHOICE PO), Take by mouth. (Patient not taking: Reported on 01/26/2020), Disp: , Rfl:  .  Multiple Vitamin (ONE-A-DAY MENS PO), Take by mouth. (Patient not taking: Reported on 01/26/2020), Disp: , Rfl:  .  Protein POWD, Take by mouth. (Patient not taking: Reported on 01/26/2020), Disp: , Rfl:  .  triamcinolone cream (KENALOG) 0.1 %, Apply 1 application topically 2 (two) times daily as needed (ACNE).  (Patient not taking: Reported on 01/26/2020), Disp: , Rfl:   No Known Allergies   Review of Systems Constitutional: -fever, -chills, -sweats, -unexpected weight change,  -decreased appetite, -fatigue Allergy: -sneezing, -itching, -congestion Dermatology: -changing moles, --rash, -lumps ENT: -runny nose, -ear pain, -sore throat, -hoarseness, -sinus pain, -teeth pain, - ringing in ears, -hearing loss, -nosebleeds Cardiology: -chest pain, -palpitations, -swelling, -difficulty breathing when lying flat, -waking up short of breath Respiratory: -cough, -shortness of breath, -difficulty breathing with exercise or exertion, -wheezing, -coughing up blood Gastroenterology: -abdominal pain, -nausea, -vomiting, -diarrhea, -constipation, -blood in stool, -changes in bowel movement, -difficulty swallowing or eating Hematology: -bleeding, -bruising  Musculoskeletal: -joint aches, -muscle aches, -joint swelling, -back pain, -neck pain, -cramping, -changes in gait Ophthalmology: denies vision changes, eye redness, itching, discharge Urology: -burning with urination, -difficulty urinating, -blood in urine, -urinary frequency, -urgency, -incontinence Neurology: -headache, -weakness, -tingling, -numbness, -memory loss, -falls, -dizziness Psychology: -depressed mood, -agitation, -sleep problems Male GU: no testicular mass, pain, no lymph nodes swollen, no swelling, no rash.     Objective:  BP 116/70   Pulse 82   Ht 5\' 6"  (1.676 m)   Wt 142 lb 12.8 oz (64.8 kg)   SpO2 97%   BMI 23.05 kg/m   General appearance: alert, no distress, WD/WN, Caucasian male Skin: scattered macules, no worrisome lesions Neck: supple, no lymphadenopathy, no thyromegaly, no masses, normal ROM, no bruits Chest: non tender, normal shape and expansion Heart: RRR, normal S1, S2, no murmurs Lungs: CTA bilaterally, no wheezes, rhonchi, or rales Abdomen: +bs, soft, non tender, non distended, no masses, no hepatomegaly, no splenomegaly, no bruits  Back: non tender, normal ROM, no scoliosis Musculoskeletal: upper extremities non tender, no obvious deformity, normal ROM throughout, lower extremities non  tender, no obvious deformity, normal ROM throughout Extremities: no edema, no cyanosis, no clubbing Pulses: 2+ symmetric, upper and lower extremities, normal cap refill Neurological: alert, oriented x 3, CN2-12 intact, strength normal upper extremities and lower extremities, sensation normal throughout, DTRs 2+ throughout, no cerebellar signs, gait normal Psychiatric: normal affect, behavior normal, pleasant  GU: normal male external genitalia,circumcised, nontender, no masses, no hernia, no lymphadenopathy Rectal: deferred   Assessment and Plan :   Encounter Diagnoses  Name Primary?  . Encounter for health maintenance examination in adult Yes  . Prophylactic measure   . High risk medication use   . Screening for lipid disorders   . Vaccine counseling   . Screen for STD (sexually transmitted disease)     Physical exam - discussed and counseled on healthy lifestyle, diet, exercise, preventative care, vaccinations, sick and well care, proper use of emergency dept and after hours care, and addressed their concerns.    Health screening: See your eye doctor yearly for routine vision care. See your dentist yearly for routine dental care including hygiene visits twice yearly.  Discussed STD testing, discussed prevention, condom use, means of transmission  Cancer screening Advised monthly self testicular exam   Vaccinations: Advised yearly influenza vaccine Up to date on tetanus booster  Separate significant chronic issues discussed: Discussed PreP therapy, discussed risk/benefits of medication, routine STD screening, safe sex, condom use.   Wesley Joseph was seen today for annual exam.  Diagnoses and all orders for this visit:  Encounter for health maintenance examination in adult -     Comprehensive metabolic panel -     CBC -     Lipid panel -     HIV Antibody (routine testing w rflx) -     RPR -     GC/Chlamydia Probe Amp -     Hepatitis C antibody -     Hepatitis B surface  antibody,quantitative -     Hepatitis B surface antigen  Prophylactic measure  High risk medication use  Screening for lipid disorders -     Lipid panel  Vaccine counseling  Screen for STD (sexually transmitted disease) -     HIV Antibody (routine testing w rflx) -     RPR -     GC/Chlamydia Probe Amp -     Hepatitis C antibody -     Hepatitis B surface antibody,quantitative -     Hepatitis B surface antigen    Follow-up pending labs, yearly for physical

## 2020-01-27 ENCOUNTER — Other Ambulatory Visit: Payer: Self-pay

## 2020-01-27 ENCOUNTER — Other Ambulatory Visit: Payer: Self-pay | Admitting: Medical

## 2020-01-27 ENCOUNTER — Emergency Department (HOSPITAL_BASED_OUTPATIENT_CLINIC_OR_DEPARTMENT_OTHER): Payer: 59

## 2020-01-27 ENCOUNTER — Encounter (HOSPITAL_BASED_OUTPATIENT_CLINIC_OR_DEPARTMENT_OTHER): Payer: Self-pay | Admitting: *Deleted

## 2020-01-27 ENCOUNTER — Emergency Department (HOSPITAL_BASED_OUTPATIENT_CLINIC_OR_DEPARTMENT_OTHER)
Admission: EM | Admit: 2020-01-27 | Discharge: 2020-01-27 | Disposition: A | Discharge status: Home / Self Care | Payer: 59 | Attending: Emergency Medicine | Admitting: Emergency Medicine

## 2020-01-27 DIAGNOSIS — R945 Abnormal results of liver function studies: Secondary | ICD-10-CM | POA: Diagnosis not present

## 2020-01-27 DIAGNOSIS — R7989 Other specified abnormal findings of blood chemistry: Secondary | ICD-10-CM

## 2020-01-27 LAB — LIPID PANEL
Chol/HDL Ratio: 3 ratio (ref 0.0–5.0)
Cholesterol, Total: 146 mg/dL (ref 100–199)
HDL: 49 mg/dL (ref >39)
LDL Chol Calc (NIH): 86 mg/dL (ref 0–99)
Triglycerides: 54 mg/dL (ref 0–149)
VLDL Cholesterol Cal: 11 mg/dL (ref 5–40)

## 2020-01-27 LAB — COMPREHENSIVE METABOLIC PANEL
ALT: 398 IU/L — ABNORMAL HIGH (ref 0–44)
AST: 1176 IU/L (ref 0–40)
Albumin/Globulin Ratio: 2.4 — ABNORMAL HIGH (ref 1.2–2.2)
Albumin: 5 g/dL (ref 4.1–5.2)
Alkaline Phosphatase: 89 IU/L (ref 48–121)
BUN/Creatinine Ratio: 13 (ref 9–20)
BUN: 13 mg/dL (ref 6–20)
Bilirubin Total: 1.1 mg/dL (ref 0.0–1.2)
CO2: 23 mmol/L (ref 20–29)
Calcium: 10 mg/dL (ref 8.7–10.2)
Chloride: 102 mmol/L (ref 96–106)
Creatinine, Ser: 0.99 mg/dL (ref 0.76–1.27)
GFR calc Af Amer: 119 mL/min/{1.73_m2} (ref >59)
GFR calc non Af Amer: 103 mL/min/{1.73_m2} (ref >59)
Globulin, Total: 2.1 g/dL (ref 1.5–4.5)
Glucose: 95 mg/dL (ref 65–99)
Potassium: 4.8 mmol/L (ref 3.5–5.2)
Sodium: 138 mmol/L (ref 134–144)
Total Protein: 7.1 g/dL (ref 6.0–8.5)

## 2020-01-27 LAB — HIV ANTIBODY (ROUTINE TESTING W REFLEX): HIV Screen 4th Generation wRfx: NONREACTIVE

## 2020-01-27 LAB — CBC
Hematocrit: 44.9 % (ref 37.5–51.0)
Hemoglobin: 16.1 g/dL (ref 13.0–17.7)
MCH: 31 pg (ref 26.6–33.0)
MCHC: 35.9 g/dL — ABNORMAL HIGH (ref 31.5–35.7)
MCV: 86 fL (ref 79–97)
Platelets: 184 10*3/uL (ref 150–450)
RBC: 5.2 x10E6/uL (ref 4.14–5.80)
RDW: 13.2 % (ref 11.6–15.4)
WBC: 4.1 10*3/uL (ref 3.4–10.8)

## 2020-01-27 LAB — ETHANOL: Alcohol, Ethyl (B): <10 mg/dL (ref <10)

## 2020-01-27 LAB — GC/CHLAMYDIA PROBE AMP
Chlamydia trachomatis, NAA: NEGATIVE
Neisseria Gonorrhoeae by PCR: NEGATIVE

## 2020-01-27 LAB — HEPATITIS B SURFACE ANTIBODY, QUANTITATIVE: Hepatitis B Surf Ab Quant: >1000 m[IU]/mL (ref >9.9)

## 2020-01-27 LAB — HEPATITIS B SURFACE ANTIGEN: Hepatitis B Surface Ag: NEGATIVE

## 2020-01-27 LAB — HEPATITIS C ANTIBODY: Hep C Virus Ab: <0.1 s/co ratio (ref 0.0–0.9)

## 2020-01-27 LAB — RPR: RPR Ser Ql: NONREACTIVE

## 2020-01-27 LAB — PROTIME-INR
INR: 1.1 (ref 0.8–1.2)
Prothrombin Time: 13.6 seconds (ref 11.4–15.2)

## 2020-01-27 LAB — ACETAMINOPHEN LEVEL: Acetaminophen (Tylenol), Serum: <10 ug/mL — ABNORMAL LOW (ref 10–30)

## 2020-01-27 NOTE — ED Provider Notes (Signed)
[facility] Provider Note   CSN: [id number] Arrival date & time: 01/27/20  1717     History Chief Complaint  Patient presents with  . Abnormal labs    Wesley Joseph is a 29 y.o. male presenting for evaluation of abnormal liver function test.  Patient states he had a annual physical with his PCP yesterday. He had blood drawn, was told to come to the ER today due to abnormal labs. He reports no previous history of abnormality of his liver. He denies fevers, chills, nausea, vomiting, abdominal pain, urinary symptoms, normal bowel movements. He denies starting any new medicines in the past year. He denies bleeding. He has no family history of GI problems. He has never needed to see a GI doctor before. He takes Truvada for prep, ampicillin for acne, and otherwise no other significant medical problems or medicines. He denies tobacco, alcohol, or current drug use. 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  HPI     History reviewed. No pertinent past medical history.  Patient Active Problem List   Diagnosis Date Noted  . Encounter for health maintenance examination in adult 01/26/2020  . Screening for lipid disorders 01/26/2020  . Vaccine counseling 01/26/2020  . Screen for STD (sexually transmitted disease) 01/26/2020  . High risk medication use 04/14/2018  . Prophylactic measure 03/20/2017  . High risk sexual behavior 03/20/2017  . Need for HPV vaccination 03/20/2017  . Need for influenza vaccination 03/20/2017    Past Surgical History:  Procedure Laterality Date  . HERNIA REPAIR  1992  . WART FULGURATION N/A 02/07/2015   Procedure: FULGURATION ANAL CONDYLOMA;  Surgeon: Aviva Signs, MD;  Location: [facility];  Service: General;  Laterality: N/A;       Family History  Problem Relation Age of Onset  . Hypertension Mother   . Alzheimer's disease Maternal Grandmother   . Diabetes Maternal Grandfather   . Diabetes Paternal Grandfather   . Heart  disease Neg Hx   . Cancer Neg Hx   . Stroke Neg Hx     Social History   Tobacco Use  . Smoking status: Never Smoker  . Smokeless tobacco: Never Used  Vaping Use  . Vaping Use: Never used  Substance Use Topics  . Alcohol use: Yes    Alcohol/week: 3.0 standard drinks    Types: 1 Glasses of wine, 1 Cans of beer, 1 Shots of liquor per week    Comment: occ  . Drug use: No    Home Medications Prior to Admission medications   Medication Sig Start Date End Date Taking? Authorizing Provider  ampicillin (PRINCIPEN) 500 MG capsule Take 500 mg by mouth daily. 12/08/17   [provider]  emtricitabine-tenofovir (TRUVADA) 200-300 MG tablet Take 1 tablet by mouth daily. Patient not taking: Reported on 01/26/2020 10/30/19   Tysinger, Camelia Eng, PA-C  Inulin (FIBER CHOICE PO) Take by mouth. Patient not taking: Reported on 01/26/2020    [provider]  Multiple Vitamin (ONE-A-DAY MENS PO) Take by mouth. Patient not taking: Reported on 01/26/2020    [provider]  Protein POWD Take by mouth. Patient not taking: Reported on 01/26/2020    [provider]  triamcinolone cream (KENALOG) 0.1 % Apply 1 application topically 2 (two) times daily as needed (ACNE).  Patient not taking: Reported on 01/26/2020    [provider]    Allergies    Patient has no known allergies.  Review of Systems  Review of Systems  All other systems reviewed and are negative.   Physical Exam Updated Vital Signs BP 129/77 (BP Location: Left Arm)   Pulse 70   Temp 98.4 F (36.9 C) (Oral)   Resp 14   Ht '5\' 6"'$  (1.676 m)   Wt 63.5 kg   SpO2 99%   BMI 22.60 kg/m   Physical Exam Vitals and nursing note reviewed.  Constitutional:      General: He is not in acute distress.    Appearance: He is well-developed.     Comments: Resting comfortably in bed in no acute distress  HENT:     Head: Normocephalic and atraumatic.  Eyes:     Conjunctiva/sclera: Conjunctivae normal.       Pupils: Pupils are equal, round, and reactive to light.  Cardiovascular:     Rate and Rhythm: Normal rate and regular rhythm.     Pulses: Normal pulses.  Pulmonary:     Effort: Pulmonary effort is normal. No respiratory distress.     Breath sounds: Normal breath sounds. No wheezing.  Abdominal:     General: There is no distension.     Palpations: Abdomen is soft. There is no mass.     Tenderness: There is no abdominal tenderness. There is no guarding or rebound.     Comments: No tenderness to palpation the abdomen. No rigidity, guarding, distention. Negative rebound. No peritonitis.  Musculoskeletal:        General: Normal range of motion.     Cervical back: Normal range of motion and neck supple.  Skin:    General: Skin is warm and dry.     Capillary Refill: Capillary refill takes less than 2 seconds.  Neurological:     Mental Status: He is alert and oriented to person, place, and time.     ED Results / Procedures / Treatments   Labs (all labs ordered are listed, but only abnormal results are displayed) Labs Reviewed  ACETAMINOPHEN LEVEL - Abnormal; Notable for the following components:      Result Value   Acetaminophen (Tylenol), Serum <10 (*)    All other components within normal limits  PROTIME-INR  ETHANOL  ANTI-SMOOTH MUSCLE ANTIBODY, IGG  FERRITIN  CERULOPLASMIN  ALPHA-1-ANTITRYPSIN    EKG None  Radiology US Abdomen Limited RUQ  Result Date: 01/27/2020 CLINICAL DATA:  Initial evaluation for elevated LFTs. EXAM: ULTRASOUND ABDOMEN LIMITED RIGHT UPPER QUADRANT COMPARISON:  None. FINDINGS: Gallbladder: No gallstones or wall thickening visualized. No sonographic Murphy sign noted by sonographer. Common bile duct: Diameter: 2.4 mm Liver: No focal lesion identified. Within normal limits in parenchymal echogenicity. Portal vein is patent on color Doppler imaging with normal direction of blood flow towards the liver. Other: None. IMPRESSION: Normal right upper  quadrant ultrasound. No acute abnormality identified. Electronically Signed   By: Jeannine Boga M.D.   On: 01/27/2020 21:01    Procedures Procedures (including critical care time)  Medications Ordered in ED Medications - No data to display  ED Course  I have reviewed the triage vital signs and the nursing notes.  Pertinent labs & imaging results that were available during my care of the patient were reviewed by me and considered in my medical decision making (see chart for details).    MDM Rules/Calculators/A&P                          Patient presenting for evaluation of abnormal liver function test. AST greater  than 1000, ALT 400. Alk phos and bili are normal. He has no infectious symptoms to indicate gallbladder etiology. However, due to newly abnormal LFTs, obtain ultrasound for further evaluation. Will obtain Tylenol, ethanol, and PT/INR to ensure no abnormalities. Platelets and white count were normal with lab testing earlier today. Consider elevation of LFTs due to Truvada. Consider hepatitis. Hepatitis panel drawn with labs earlier.  PT/INR normal. Ultrasound negative for acute findings. Will consult with GI.  Discussed with Dr. Watt Climes from GI who recommends adding on anti-smooth muscle, ferritin, ceruloplasmin, and alpha anti-one trypsin testing. Recommend patient follow-up closely outpatient, but does not need admission at this time. I discussed findings and plan with patient, who is agreeable. At this time, patient appears safe for discharge. Return precautions given. Patient states he understands and agrees to plan.  Final Clinical Impression(s) / ED Diagnoses Final diagnoses:  Elevated LFTs    Rx / DC Orders ED Discharge Orders    None       Franchot Heidelberg, PA-C 01/27/20 2202    Veryl Speak, MD 01/27/20 2358

## 2020-01-27 NOTE — ED Triage Notes (Signed)
Sent here by PMD for elevated LFT

## 2020-01-27 NOTE — Discharge Instructions (Addendum)
Stop taking Truvada, as this may be causing the change in your liver enzymes. Do not take any over-the-counter Tylenol/acetaminophen. Make sure that the medicines you are taking do not include this medicine. Follow-up with the GI doctor for further evaluation of your liver. You may either call your primary care doctor for referral, or follow-up with the GI doctor listed below. Return to the emergency room if you develop fevers, vomiting, abdominal pain, your eyes/skin become yellow, with any new, worsening, concerning symptoms.

## 2020-01-27 NOTE — ED Notes (Signed)
Pt to US.

## 2020-01-28 ENCOUNTER — Telehealth: Payer: Self-pay | Admitting: Medical

## 2020-01-28 LAB — FERRITIN: Ferritin: 49 ng/mL (ref 24–336)

## 2020-01-28 NOTE — Telephone Encounter (Signed)
Appointment has been scheduled.

## 2020-01-28 NOTE — Telephone Encounter (Signed)
Call later today.    I appreciate him going to the ED, as I was concerned about liver damage.   I have reviewed the ultrasound which was normal.  2 labs are still pending ,but the other labs were normal.   From what I could tell, their concern might be the Travada Prep causing the liver inflammation.  Was that his take away from the ED?   Did they give him other recommendations or follow up?

## 2020-01-28 NOTE — Telephone Encounter (Signed)
Spoke to patient he stated he was told to stop taking the [place].

## 2020-01-28 NOTE — Telephone Encounter (Signed)
See prior message.  The dictation read generic instead of your name Genera.  Sorry.

## 2020-01-28 NOTE — Telephone Encounter (Signed)
As we discussed generic, he will need follow-up next week for lab and we are still pending some labs

## 2020-01-29 LAB — ALPHA-1-ANTITRYPSIN: A-1 Antitrypsin, Ser: 151 mg/dL (ref 95–164)

## 2020-01-29 LAB — CERULOPLASMIN: Ceruloplasmin: 17.3 mg/dL (ref 16.0–31.0)

## 2020-01-30 LAB — ANTI-SMOOTH MUSCLE ANTIBODY, IGG: F-Actin IgG: 5 Units (ref 0–19)

## 2020-02-01 ENCOUNTER — Telehealth: Payer: Self-pay | Admitting: Medical

## 2020-02-01 ENCOUNTER — Other Ambulatory Visit: Payer: 59

## 2020-02-01 NOTE — Telephone Encounter (Signed)
Pt called back and said his liver levels went down to almost half of what they were at his last visit

## 2020-02-01 NOTE — Telephone Encounter (Signed)
Glad to hear.   Are they repeating them again in another week or what was the plan?

## 2020-02-01 NOTE — Telephone Encounter (Signed)
Pt called and cancled his lab works states that he is going to get his blood work done Friday at his GI dr on Friday pt states he will fu with them after the gets his blood work with them

## 2020-02-03 ENCOUNTER — Other Ambulatory Visit: Payer: 59

## 2020-02-04 ENCOUNTER — Other Ambulatory Visit: Payer: 59

## 2020-03-03 ENCOUNTER — Telehealth: Payer: Self-pay | Admitting: Medical

## 2020-03-03 ENCOUNTER — Other Ambulatory Visit: Payer: Self-pay | Admitting: Medical

## 2020-03-03 NOTE — Telephone Encounter (Signed)
Please see form or telephone message.  He is due for follow-up liver test.  We still have to discuss how to move forward with HIV prevention

## 2020-03-04 NOTE — Telephone Encounter (Signed)
Patient has an appointment scheduled for 03/14/20

## 2020-03-14 ENCOUNTER — Ambulatory Visit: Payer: 59 | Admitting: Medical

## 2020-03-14 ENCOUNTER — Other Ambulatory Visit: Payer: Self-pay

## 2020-03-14 ENCOUNTER — Encounter: Payer: Self-pay | Admitting: Medical

## 2020-03-14 VITALS — BP 100/68 | HR 60 | Ht 66.0 in | Wt 145.6 lb

## 2020-03-14 DIAGNOSIS — R7989 Other specified abnormal findings of blood chemistry: Secondary | ICD-10-CM

## 2020-03-14 DIAGNOSIS — R768 Other specified abnormal immunological findings in serum: Secondary | ICD-10-CM | POA: Diagnosis not present

## 2020-03-14 DIAGNOSIS — Z299 Encounter for prophylactic measures, unspecified: Secondary | ICD-10-CM | POA: Diagnosis not present

## 2020-03-14 DIAGNOSIS — B259 Cytomegaloviral disease, unspecified: Secondary | ICD-10-CM | POA: Diagnosis not present

## 2020-03-14 DIAGNOSIS — R7689 Other specified abnormal immunological findings in serum: Secondary | ICD-10-CM | POA: Insufficient documentation

## 2020-03-14 DIAGNOSIS — Z79899 Other long term (current) drug therapy: Secondary | ICD-10-CM

## 2020-03-14 NOTE — Progress Notes (Addendum)
Subjective:  Wesley Joseph is a 29 y.o. male who presents for Chief Complaint  Patient presents with  . Consult    discuss prep/ labs      Here today for follow-up on elevated liver test.  He recently came in for a physical and his liver tests were found to be quite high.  He had an ED visit, had additional follow-up with gastroenterology.  He had a number of other lab tests done.  At his last visit with gastroenterology they advised he have a rhabdomyolysis screening every year.  He has not started back on prep.  He has been on Truvada prep generic this past year.  Oceanographer.   He drinks limited alcohol.  No drug use now, but has used in the past.  Last use > 2 years ago.   No other aggravating or relieving factors.    No other c/o.  No past medical history on file.  Current Outpatient Medications on File Prior to Visit  Medication Sig Dispense Refill  . ampicillin (PRINCIPEN) 500 MG capsule Take 500 mg by mouth daily. (Patient not taking: Reported on 03/14/2020)  5  . emtricitabine-tenofovir (TRUVADA) 200-300 MG tablet Take 1 tablet by mouth daily. (Patient not taking: Reported on 01/26/2020) 90 tablet 0  . Inulin (FIBER CHOICE PO) Take by mouth. (Patient not taking: Reported on 01/26/2020)    . Multiple Vitamin (ONE-A-DAY MENS PO) Take by mouth. (Patient not taking: Reported on 01/26/2020)    . Protein POWD Take by mouth. (Patient not taking: Reported on 01/26/2020)    . triamcinolone cream (KENALOG) 0.1 % Apply 1 application topically 2 (two) times daily as needed (ACNE).  (Patient not taking: Reported on 01/26/2020)     No current facility-administered medications on file prior to visit.     The following portions of the patient's history were reviewed and updated as appropriate: allergies, current medications, past family history, past medical history, past social history, past surgical history and problem list.  ROS Otherwise as in subjective  above    Objective: BP 100/68   Pulse 60   Ht 5\' 6"  (1.676 m)   Wt 145 lb 9.6 oz (66 kg)   SpO2 98%   BMI 23.50 kg/m   General appearance: alert, no distress, well developed, well nourished Heart: RRR, normal S1, S2, no murmurs Lungs: CTA bilaterally, no wheezes, rhonchi, or rales Abdomen: +bs, soft, non tender, non distended, no masses, no hepatomegaly, no splenomegaly Pulses: 2+ radial pulses, 2+ pedal pulses, normal cap refill Ext: no edema     Assessment: Encounter Diagnoses  Name Primary?  . Elevated LFTs Yes  . Cytomegalovirus infection, unspecified cytomegaloviral infection type (HCC)   . Abnormal hepatitis serology   . Prophylactic measure   . High risk medication use      Plan:  I reviewed back over his emergency department notes, labs including the consult notes from recent visit with gastroenterology, Dr. .  He had improving AST, ALT that were trending down as of the February 01, 2020 visit.  He did have a positive antibody for cytomegalovirus.  He had a positive antibody for mitochondrial antibody.  This was reportedly rechecked and normal upon recheck but I do not have those results.  Other testing he had done that were negative was negative EBV, normal ferritin, comprehensive metabolic panel normal other than the AST and ALT.  Anti-smooth muscle antibody normal.  He is AST was 539, ALT 287 on the  July 19 labs.  They were much higher than this at the hospital visit on 01/27/20  I will reach out to gastroenterology to consult about any other follow-up and safety going forward with PreP.  Dr. Kerin Salen, [facility]  He was on generic name truvada most recently per his pharmacy   Addendum: I called and spoke to [facility], Dr. Marca Ancona.  After discussion she recommended that we could try back on Truvada but checking liver test/hepatic panel every 6 weeks.  After 2 or 3 rounds of repeat liver tests as long as the liver tests are stabilized or normal  then he can safely resume Truvada.  However if his liver tests start to increase again then we would just have to avoid this medicine indefinitely.  They felt like his liver tests was more related to rhabdomyolysis as he has started doing some weight training, heavier exercise and less likely due to medication induced from the Truvada.  Wesley Joseph was seen today for consult.  Diagnoses and all orders for this visit:  Elevated LFTs -     Hepatic function panel  Cytomegalovirus infection, unspecified cytomegaloviral infection type (HCC)  Abnormal hepatitis serology  Prophylactic measure  High risk medication use    Follow up: pending labs, call back

## 2020-03-15 ENCOUNTER — Other Ambulatory Visit: Payer: Self-pay | Admitting: Medical

## 2020-03-15 DIAGNOSIS — R7989 Other specified abnormal findings of blood chemistry: Secondary | ICD-10-CM

## 2020-03-15 DIAGNOSIS — Z79899 Other long term (current) drug therapy: Secondary | ICD-10-CM

## 2020-03-15 LAB — HEPATIC FUNCTION PANEL
ALT: 22 IU/L (ref 0–44)
AST: 25 IU/L (ref 0–40)
Albumin: 5 g/dL (ref 4.1–5.2)
Alkaline Phosphatase: 71 IU/L (ref 48–121)
Bilirubin Total: 0.9 mg/dL (ref 0.0–1.2)
Bilirubin, Direct: 0.23 mg/dL (ref 0.00–0.40)
Total Protein: 6.9 g/dL (ref 6.0–8.5)

## 2020-03-15 MED ORDER — EMTRICITABINE-TENOFOVIR DF 200-300 MG PO TABS: 1.0000 | ORAL_TABLET | Freq: Every day | ORAL | 0 refills | Status: DC

## 2020-04-27 ENCOUNTER — Ambulatory Visit: Payer: 59 | Admitting: Medical

## 2020-04-27 ENCOUNTER — Encounter: Payer: Self-pay | Admitting: Medical

## 2020-04-27 ENCOUNTER — Other Ambulatory Visit: Payer: Self-pay

## 2020-04-27 VITALS — BP 118/78 | HR 62 | Temp 97.8°F | Wt 139.4 lb

## 2020-04-27 DIAGNOSIS — Z23 Encounter for immunization: Secondary | ICD-10-CM | POA: Diagnosis not present

## 2020-04-27 DIAGNOSIS — Z299 Encounter for prophylactic measures, unspecified: Secondary | ICD-10-CM | POA: Diagnosis not present

## 2020-04-27 DIAGNOSIS — R768 Other specified abnormal immunological findings in serum: Secondary | ICD-10-CM

## 2020-04-27 DIAGNOSIS — Z79899 Other long term (current) drug therapy: Secondary | ICD-10-CM | POA: Diagnosis not present

## 2020-04-27 DIAGNOSIS — Z113 Encounter for screening for infections with a predominantly sexual mode of transmission: Secondary | ICD-10-CM | POA: Diagnosis not present

## 2020-04-27 DIAGNOSIS — R310 Gross hematuria: Secondary | ICD-10-CM | POA: Diagnosis not present

## 2020-04-27 LAB — POCT URINALYSIS DIP (PROADVANTAGE DEVICE)
Bilirubin, UA: NEGATIVE
Glucose, UA: NEGATIVE mg/dL
Ketones, POC UA: NEGATIVE mg/dL
Leukocytes, UA: NEGATIVE
Nitrite, UA: NEGATIVE
Protein Ur, POC: NEGATIVE mg/dL
Specific Gravity, Urine: 1.01
Urobilinogen, Ur: 0.2
pH, UA: 7 (ref 5.0–8.0)

## 2020-04-27 NOTE — Progress Notes (Signed)
Subjective:  Wesley Joseph is a 29 y.o. male who presents for Chief Complaint  Patient presents with  . other    f/u apt for prep and liver and kidney blood in urine Monday      Here for f/u on PreP and liver  Since last visit has restarted prep, taking it daily for HIV prophylaxis.   Back in 01/2020 he was found to have quite elevated LFTs, had ED visit, GI consult, and was determined to be CMV infection.  GI felt it was safe to resume Prep after liver tests normalized.  He does note some recent new symptoms.  2 days ago saw a small amount of blood in the urine.  Saw blood in urine a few times the same day.   otherwise been in normal state of health.  No fever, back or belly pain, no NVD, no chills, no body aches.  No blood in semen.  He does note hx/o blood in semen in high school, resolved with antibiotics treatment for infection.  No prior treatment for prostatitis. No recent strenuous exercise, fall or trauma.  No other aggravating or relieving factors.    No other c/o.  The following portions of the patient's history were reviewed and updated as appropriate: allergies, current medications, past family history, past medical history, past social history, past surgical history and problem list.  ROS Otherwise as in subjective above  Objective: BP 118/78   Pulse 62   Temp 97.8 F (36.6 C)   Wt 139 lb 6.4 oz (63.2 kg)   BMI 22.50 kg/m   General appearance: alert, no distress, well developed, well nourished Abdomen: +bs, soft, non tender, non distended, no masses, no hepatomegaly, no splenomegaly Pulses: 2+ radial pulses, 2+ pedal pulses, normal cap refill Ext: no edema   Assessment: Encounter Diagnoses  Name Primary?  Michaell Cowing hematuria   . Prophylactic measure   . Need for influenza vaccination Yes  . High risk medication use   . Screen for STD (sexually transmitted disease)   . Abnormal hepatitis serology      Plan: Gross hematuria-discussed possible causes.   Labs as below.  Increase water intake, drink some acidic fluids such as lemon juice or cranberry juice the next few days.  If any worsening or change in symptoms next 72 hours then call back particularly if fever body aches or chills or worsening gross hematuria  On prep therapy, routine labs today for surveillance, continue with condoms  Status post elevated liver test back in July ultimately found to be related to cytomegalovirus infection.  He was off Prep therapy for a month or so awaiting liver test to normalize  Counseled on the influenza virus vaccine.  Vaccine information sheet given.  Influenza vaccine given after consent obtained.    Conn was seen today for other.  Diagnoses and all orders for this visit:  Need for influenza vaccination -     Flu Vaccine QUAD 6+ mos PF IM (Fluarix Quad PF)  Gross hematuria -     GC/Chlamydia Probe Amp -     POCT Urinalysis DIP (Proadvantage Device) -     Urine Culture  Prophylactic measure -     HIV Antibody (routine testing w rflx) -     RPR -     GC/Chlamydia Probe Amp  High risk medication use -     HIV Antibody (routine testing w rflx) -     RPR -     GC/Chlamydia Probe Amp  Screen for STD (sexually transmitted disease) -     HIV Antibody (routine testing w rflx) -     RPR -     GC/Chlamydia Probe Amp  Abnormal hepatitis serology -     Hepatic function panel    Follow up: pending labs

## 2020-04-28 ENCOUNTER — Other Ambulatory Visit: Payer: Self-pay | Admitting: Medical

## 2020-04-28 LAB — HEPATIC FUNCTION PANEL
ALT: 19 IU/L (ref 0–44)
AST: 20 IU/L (ref 0–40)
Albumin: 4.9 g/dL (ref 4.1–5.2)
Alkaline Phosphatase: 82 IU/L (ref 44–121)
Bilirubin Total: 1 mg/dL (ref 0.0–1.2)
Bilirubin, Direct: 0.28 mg/dL (ref 0.00–0.40)
Total Protein: 7.1 g/dL (ref 6.0–8.5)

## 2020-04-28 LAB — RPR: RPR Ser Ql: NONREACTIVE

## 2020-04-28 LAB — HIV ANTIBODY (ROUTINE TESTING W REFLEX): HIV Screen 4th Generation wRfx: NONREACTIVE

## 2020-04-28 MED ORDER — EMTRICITABINE-TENOFOVIR DF 200-300 MG PO TABS: 1.0000 | ORAL_TABLET | Freq: Every day | ORAL | 0 refills | Status: DC

## 2020-04-29 LAB — GC/CHLAMYDIA PROBE AMP
Chlamydia trachomatis, NAA: NEGATIVE
Neisseria Gonorrhoeae by PCR: NEGATIVE

## 2020-04-29 LAB — URINE CULTURE: Organism ID, Bacteria: NO GROWTH

## 2020-05-02 ENCOUNTER — Encounter: Payer: Self-pay | Admitting: Medical

## 2020-05-02 ENCOUNTER — Ambulatory Visit: Payer: 59 | Admitting: Medical

## 2020-05-02 ENCOUNTER — Telehealth: Payer: Self-pay

## 2020-05-02 ENCOUNTER — Other Ambulatory Visit: Payer: Self-pay

## 2020-05-02 VITALS — BP 124/80 | HR 68 | Ht 66.0 in | Wt 140.4 lb

## 2020-05-02 DIAGNOSIS — R319 Hematuria, unspecified: Secondary | ICD-10-CM

## 2020-05-02 DIAGNOSIS — R361 Hematospermia: Secondary | ICD-10-CM

## 2020-05-02 MED ORDER — DOXYCYCLINE HYCLATE 100 MG PO TABS
100.0000 mg | ORAL_TABLET | Freq: Two times a day (BID) | ORAL | 0 refills | Status: DC
Start: 2020-05-02 — End: 2020-05-16

## 2020-05-02 NOTE — Telephone Encounter (Signed)
Pt now has blood in semen,  I recommended appt and scheduled for this afternoon

## 2020-05-02 NOTE — Progress Notes (Signed)
Subjective:  Wesley Joseph is a 29 y.o. male who presents for Chief Complaint  Patient presents with   Follow-up    hematuria-patient now has blood in semen       Follow up for Gross Hematuria  The patient was last seen for this 5 days ago. Changes made at last visit include Increase water intake, drink some acidic fluids such as lemon juice or cranberry juice.  He reports good compliance with recommendations. He feels that blood in urine cleared up. Patient presents today with blood in semen that started 04/30/20.  ----------------------------------------------------------------------------------------- Last week when he was here he had a slight bit of blood in urine but that cleared up.  He had several days of nursing and over the weekend had a few episodes of blood in the semen but not in the urine.  He has no pain.  No recent injury or trauma or pelvic trauma   No other aggravating or relieving factors.    No other c/o.  The following portions of the patient's history were reviewed and updated as appropriate: allergies, current medications, past family history, past medical history, past social history, past surgical history and problem list.  ROS Otherwise as in subjective above  Objective: BP 124/80    Pulse 68    Ht 5\' 6"  (1.676 m)    Wt 140 lb 6.4 oz (63.7 kg)    SpO2 98%    BMI 22.66 kg/m   General appearance: alert, no distress, well developed, well nourished    Assessment: Encounter Diagnoses  Name Primary?   Hematospermia Yes   Hematuria, unspecified type      Plan: We discussed his symptoms and concerns.  I reviewed his labs and came in over the weekend.  He was negative for urine culture or STD.  Begin round of doxycycline.  Rest, hydrate well.   Discussed possible differential which could include nongonococcal urethritis, prostatitis, tumor or other.  If not resolved within 10 days of antibiotic then we will consider next steps.  Call or recheck in 10  days  [name] was seen today for follow-up.  Diagnoses and all orders for this visit:  Hematospermia  Hematuria, unspecified type  Other orders -     doxycycline (VIBRA-TABS) 100 MG tablet; Take 1 tablet (100 mg total) by mouth 2 (two) times daily.    Follow up: Call report or recheck in 10 days

## 2020-05-02 NOTE — Patient Instructions (Signed)

## 2020-05-16 ENCOUNTER — Other Ambulatory Visit: Payer: Self-pay | Admitting: Medical

## 2020-05-16 ENCOUNTER — Telehealth: Payer: Self-pay

## 2020-05-16 DIAGNOSIS — R361 Hematospermia: Secondary | ICD-10-CM

## 2020-05-16 DIAGNOSIS — Z79899 Other long term (current) drug therapy: Secondary | ICD-10-CM

## 2020-05-16 MED ORDER — AZITHROMYCIN 500 MG PO TABS
1000.0000 mg | ORAL_TABLET | Freq: Every day | ORAL | 0 refills | Status: DC
Start: 2020-05-16 — End: 2020-05-20

## 2020-05-16 NOTE — Telephone Encounter (Signed)
Pt. Called stating that his was seen a few weeks ago for blood in his semen and there has been no improvement so far. He just finished his medication you gave him he wasn't sure if he needed another round of that or something different called in.

## 2020-05-16 NOTE — Telephone Encounter (Signed)
Lets do a round of azithromycin 500 mg, 2 tablets once  Go ahead make a referral to urology as well for blood in semen and blood in urine

## 2020-05-17 NOTE — Telephone Encounter (Signed)
Referral sent to [facility]

## 2020-05-20 ENCOUNTER — Telehealth: Payer: Self-pay | Admitting: Medical

## 2020-05-20 ENCOUNTER — Other Ambulatory Visit: Payer: Self-pay | Admitting: Medical

## 2020-05-20 MED ORDER — SULFAMETHOXAZOLE-TRIMETHOPRIM 800-160 MG PO TABS
1.0000 | ORAL_TABLET | Freq: Two times a day (BID) | ORAL | 0 refills | Status: DC
Start: 2020-05-20 — End: 2020-06-08

## 2020-05-20 NOTE — Telephone Encounter (Signed)
I sent bactrim, a different type of antibiotic since he still has symptoms.  Continue plan for urology follow up

## 2020-05-20 NOTE — Telephone Encounter (Signed)
Referral has been sent to urology. Brownish blood is in semen. Please advise further. Patient denies any pain

## 2020-05-20 NOTE — Telephone Encounter (Signed)
Please call patient called about zithromycin and wanted to know how long to wait for improvement. States he is not familiar with This medication

## 2020-05-20 NOTE — Telephone Encounter (Signed)
1- make sure we referred to urology due to blood in urine from last visit 2- azithromycin treats certain infections.   If no improvement, find out what symptoms he has now in case I need to send anything else to pharmacy

## 2020-06-08 ENCOUNTER — Ambulatory Visit: Payer: 59 | Admitting: Medical

## 2020-06-08 ENCOUNTER — Other Ambulatory Visit: Payer: Self-pay

## 2020-06-08 ENCOUNTER — Encounter: Payer: Self-pay | Admitting: Medical

## 2020-06-08 VITALS — BP 112/70 | HR 60 | Ht 66.0 in | Wt 143.0 lb

## 2020-06-08 DIAGNOSIS — R7989 Other specified abnormal findings of blood chemistry: Secondary | ICD-10-CM

## 2020-06-08 DIAGNOSIS — N419 Inflammatory disease of prostate, unspecified: Secondary | ICD-10-CM

## 2020-06-08 DIAGNOSIS — R361 Hematospermia: Secondary | ICD-10-CM | POA: Diagnosis not present

## 2020-06-08 DIAGNOSIS — N50819 Testicular pain, unspecified: Secondary | ICD-10-CM

## 2020-06-08 LAB — POCT URINALYSIS DIP (PROADVANTAGE DEVICE)
Bilirubin, UA: NEGATIVE
Blood, UA: NEGATIVE
Glucose, UA: NEGATIVE mg/dL
Ketones, POC UA: NEGATIVE mg/dL
Leukocytes, UA: NEGATIVE
Nitrite, UA: NEGATIVE
Protein Ur, POC: NEGATIVE mg/dL
Specific Gravity, Urine: 1.01
Urobilinogen, Ur: 0.2
pH, UA: 6.5 (ref 5.0–8.0)

## 2020-06-08 MED ORDER — CIPROFLOXACIN HCL 500 MG PO TABS
500.0000 mg | ORAL_TABLET | Freq: Two times a day (BID) | ORAL | 0 refills | Status: DC
Start: 2020-06-08 — End: 2020-06-22

## 2020-06-08 NOTE — Progress Notes (Signed)
Subjective:    Patient ID: Wesley Joseph, male    DOB: Nov 26, 1990, 29 y.o.   MRN: [id number]  HPI  Wesley Joseph is a 29 year old male in today for evaluation of his elevated liver enzymes and prep. Denies side effects from prep medication and states he has not missed any doses. States he is using condoms during intercourse with male partner. Denies side effects from recent antibiotic use.   Blood in semen has been going on for 6 weeks. Denies visible blood in the urine. Reports still has blood in semen. No improvement with antibiotics. Sometimes color is red sometimes brown. Has appointment with urologist in January 2022.   Noticed left testicle pain that started within the last week. Pain is intermittent and lasts about an hour then goes away. Denies monthly self testicular exams. Education provided about risks of testicular cancer in age group.   No bleeding in gums, no bruising, no blood in stool.  No change in appetite, no weight changes, no fever.  No burning with urination, no major change in urine frequency.   Review of Systems  Constitutional: Negative for chills and fever.  Gastrointestinal: Negative for abdominal pain, blood in stool, diarrhea, nausea, rectal pain and vomiting.  Genitourinary: Positive for testicular pain. Negative for decreased urine volume, discharge, flank pain, frequency, penile pain and urgency.   No other aggravating or relieving factors. No other complaint.     Objective:   Physical Exam  BP 112/70   Pulse 60   Ht 5\' 6"  (1.676 m)   Wt 143 lb (64.9 kg)   SpO2 98%   BMI 23.08 kg/m   Gen: wd, wn, nad GU: normal male, circ, testes nontender, no mass, no rash or lymphadenopathy, no hernia Rectal - anus normal tone, prostate boggy, no nodules Abdomen: +bs, soft, nontender, no mass, no hernia, no organomegaly     Assessment & Plan:   Encounter Diagnoses  Name Primary?  . Hematospermia Yes  . Testicle tenderness   . Prostatitis, unspecified  prostatitis type   . LFT elevation     Discussed differential.  No particular indication for ultrasound today of the scrotum. We discussed his current symptoms, ongoing hematospermia, exam findings including boggy prostate suggestive of prostatitis.  He has completed a round of doxycycline for 10 days starting on May 16, 2020, completed a azithromycin 1000 mg on 05/16/2020 and a round of Bactrim shortly after those 2 antibiotics.  He has no improvement.  Begin a round of Cipro antibiotic today.  We will continue plan for referral to urology.  Discussed the case with supervising physician Dr. 13/07/2019, and we discussed findings, prior treatment, recommendations.  Discussed risk and benefits of medicine with patient including risk of tendon rupture with Cipro.  The testicle pain is likely referred pain.  Elevated liver test from several months ago due to cytomegalovirus infection.  We have been monitoring this.  Last check it was normal.  Recheck labs today.  See prior GI notes for further information.   Wesley Joseph was seen today for follow-up.  Diagnoses and all orders for this visit:  Hematospermia -     POCT Urinalysis DIP (Proadvantage Device) -     PSA -     Comprehensive metabolic panel -     GC/Chlamydia Probe Amp  Testicle tenderness -     POCT Urinalysis DIP (Proadvantage Device) -     PSA -     Comprehensive metabolic panel -  GC/Chlamydia Probe Amp  Prostatitis, unspecified prostatitis type -     POCT Urinalysis DIP (Proadvantage Device) -     PSA -     Comprehensive metabolic panel -     GC/Chlamydia Probe Amp  LFT elevation -     Comprehensive metabolic panel  Other orders -     ciprofloxacin (CIPRO) 500 MG tablet; Take 1 tablet (500 mg total) by mouth 2 (two) times daily.  f/u pending labs

## 2020-06-09 LAB — COMPREHENSIVE METABOLIC PANEL
ALT: 25 IU/L (ref 0–44)
AST: 26 IU/L (ref 0–40)
Albumin/Globulin Ratio: 2.6 — ABNORMAL HIGH (ref 1.2–2.2)
Albumin: 5.2 g/dL (ref 4.1–5.2)
Alkaline Phosphatase: 94 IU/L (ref 44–121)
BUN/Creatinine Ratio: 9 (ref 9–20)
BUN: 9 mg/dL (ref 6–20)
Bilirubin Total: 1.2 mg/dL (ref 0.0–1.2)
CO2: 27 mmol/L (ref 20–29)
Calcium: 9.8 mg/dL (ref 8.7–10.2)
Chloride: 103 mmol/L (ref 96–106)
Creatinine, Ser: 0.97 mg/dL (ref 0.76–1.27)
GFR calc Af Amer: 122 mL/min/{1.73_m2} (ref >59)
GFR calc non Af Amer: 106 mL/min/{1.73_m2} (ref >59)
Globulin, Total: 2 g/dL (ref 1.5–4.5)
Glucose: 94 mg/dL (ref 65–99)
Potassium: 4.5 mmol/L (ref 3.5–5.2)
Sodium: 142 mmol/L (ref 134–144)
Total Protein: 7.2 g/dL (ref 6.0–8.5)

## 2020-06-09 LAB — GC/CHLAMYDIA PROBE AMP
Chlamydia trachomatis, NAA: NEGATIVE
Neisseria Gonorrhoeae by PCR: NEGATIVE

## 2020-06-09 LAB — PSA: Prostate Specific Ag, Serum: 0.9 ng/mL (ref 0.0–4.0)

## 2020-06-20 ENCOUNTER — Telehealth: Payer: Self-pay | Admitting: Medical

## 2020-06-20 NOTE — Telephone Encounter (Signed)
See his email.  Any way we can call urology back to see if they can work him in for > 1 month hx/o blood in semens, and prior blood in urine?  If not, will his insurance work at Urology in [place] or other nearby town as an alternative referral.

## 2020-06-21 NOTE — Telephone Encounter (Signed)
Consulting with patient.

## 2020-06-22 ENCOUNTER — Other Ambulatory Visit: Payer: Self-pay | Admitting: Medical

## 2020-06-22 ENCOUNTER — Telehealth: Payer: Self-pay | Admitting: Medical

## 2020-06-22 MED ORDER — CIPROFLOXACIN HCL 500 MG PO TABS: 500.0000 mg | ORAL_TABLET | Freq: Two times a day (BID) | ORAL | 0 refills | Status: DC

## 2020-06-22 NOTE — Telephone Encounter (Signed)
Apparently [facility] can't get him in any sooner than [facility]. I have fwrd a message from patient.

## 2020-06-22 NOTE — Telephone Encounter (Signed)
Status update? 

## 2020-06-22 NOTE — Telephone Encounter (Signed)
Patient appointment with [facility] is  07/18/2020

## 2020-06-22 NOTE — Telephone Encounter (Signed)
I called [facility].  They can get him in January.   [facility] just to see if any sooner there.   If not , lets try referring to [facility].  They would need office notes, demographics, insurance info.     [facility] [address]  [phone]  [facility] Urologist [address]  In [facility]  [phone]  [facility] may have urology as well.

## 2020-06-28 NOTE — Telephone Encounter (Signed)
Let him know-the soonest we can get an appointment but finish out the repeat round of antibiotic

## 2020-07-20 ENCOUNTER — Encounter: Payer: Self-pay | Admitting: Medical

## 2020-07-20 ENCOUNTER — Other Ambulatory Visit: Payer: Self-pay

## 2020-07-20 ENCOUNTER — Ambulatory Visit: Payer: 59 | Admitting: Medical

## 2020-07-20 VITALS — BP 120/84 | HR 82 | Ht 66.0 in | Wt 144.4 lb

## 2020-07-20 DIAGNOSIS — Z113 Encounter for screening for infections with a predominantly sexual mode of transmission: Secondary | ICD-10-CM

## 2020-07-20 DIAGNOSIS — Z79899 Other long term (current) drug therapy: Secondary | ICD-10-CM

## 2020-07-20 DIAGNOSIS — R361 Hematospermia: Secondary | ICD-10-CM

## 2020-07-20 DIAGNOSIS — Z299 Encounter for prophylactic measures, unspecified: Secondary | ICD-10-CM

## 2020-07-20 DIAGNOSIS — R310 Gross hematuria: Secondary | ICD-10-CM

## 2020-07-20 NOTE — Progress Notes (Signed)
Subjective:  Wesley Joseph is a 30 y.o. male who presents for Chief Complaint  Patient presents with  . Follow-up    Decrease in blood amount. Seen urology and will have a ct done.      Here for f/u.   He had been having blood in semen, and we had used a few rounds of antibiotics. He finally was able to get into see urology, Dr. Annabell Howells in [place] recently.  Advised CT, so he is scheduled to have CT scan soon.   Was prescribed some pain medication to use prior to upcoming scan and possible cystoscopy.     Overall has had some improvement, but still gets some discoloration in semen.  No other aggravating or relieving factors.    No other c/o.  The following portions of the patient's history were reviewed and updated as appropriate: allergies, current medications, past family history, past medical history, past social history, past surgical history and problem list.  ROS Otherwise as in subjective above   Objective: BP 120/84   Pulse 82   Ht 5\' 6"  (1.676 m)   Wt 144 lb 6.4 oz (65.5 kg)   SpO2 98%   BMI 23.31 kg/m   General appearance: alert, no distress, well developed, well nourished    Assessment: Encounter Diagnoses  Name Primary?  hematuria Yes  . Prophylactic measure   . High risk medication use   . Hematospermia   . Screen for STD (sexually transmitted disease)      Plan: Doing fine on Truvada prep, HIV test today, continue condom use and preventative measures  Hepatosplenomegaly, gross hematuria-just recently saw urology.  We will request the recent office visit notes.  He has an upcoming scan in cystoscopy in the next 2 weeks  Wesley Joseph was seen today for follow-up.  Diagnoses and all orders for this visit:  Gross hematuria  Prophylactic measure -     HIV Antibody (routine testing w rflx)  High risk medication use -     HIV Antibody (routine testing w rflx)  Hematospermia  Screen for STD (sexually transmitted disease) -     HIV Antibody  (routine testing w rflx)    Follow up: f/u 71mo for HIV test nurse visit

## 2020-07-21 ENCOUNTER — Other Ambulatory Visit: Payer: Self-pay | Admitting: Medical

## 2020-07-21 LAB — HIV ANTIBODY (ROUTINE TESTING W REFLEX): HIV Screen 4th Generation wRfx: NONREACTIVE

## 2020-07-21 MED ORDER — EMTRICITABINE-TENOFOVIR DF 200-300 MG PO TABS: 1.0000 | ORAL_TABLET | Freq: Every day | ORAL | 0 refills | Status: DC

## 2020-08-24 ENCOUNTER — Telehealth: Payer: Self-pay | Admitting: Medical

## 2020-08-24 NOTE — Telephone Encounter (Signed)
Records received from [facility]

## 2020-08-25 ENCOUNTER — Encounter: Payer: Self-pay | Admitting: Medical

## 2020-09-13 DIAGNOSIS — N369 Urethral disorder, unspecified: Secondary | ICD-10-CM

## 2020-09-13 HISTORY — DX: Urethral disorder, unspecified: N36.9

## 2020-09-15 ENCOUNTER — Other Ambulatory Visit: Payer: Self-pay | Admitting: Urology

## 2020-10-04 ENCOUNTER — Other Ambulatory Visit (HOSPITAL_COMMUNITY)
Admission: RE | Admit: 2020-10-04 | Discharge: 2020-10-04 | Disposition: A | Discharge status: Home / Self Care | Payer: 59 | Source: Ambulatory Visit | Attending: Urology | Admitting: Urology

## 2020-10-04 DIAGNOSIS — Z20822 Contact with and (suspected) exposure to covid-19: Secondary | ICD-10-CM | POA: Insufficient documentation

## 2020-10-04 DIAGNOSIS — Z01812 Encounter for preprocedural laboratory examination: Secondary | ICD-10-CM | POA: Diagnosis not present

## 2020-10-04 LAB — SARS CORONAVIRUS 2 (TAT 6-24 HRS): SARS Coronavirus 2: NEGATIVE

## 2020-10-05 ENCOUNTER — Other Ambulatory Visit: Payer: Self-pay

## 2020-10-05 ENCOUNTER — Encounter (HOSPITAL_BASED_OUTPATIENT_CLINIC_OR_DEPARTMENT_OTHER): Payer: Self-pay | Admitting: Urology

## 2020-10-05 NOTE — Progress Notes (Signed)
Spoke w/ via phone for pre-op interview--- PT Lab needs dos----  no             Lab results------ no COVID test ------ done 10-04-2020 negative result in epic  Arrive at ------- 0830 on 10-07-2020 NPO after MN NO Solid Food.  Clear liquids from MN until--- 0730  Med rec completed Medications to take morning of surgery ----- Truvada Diabetic medication ----- n/a  Patient instructed to bring photo id and insurance card day of surgery Patient aware to have Driver (ride ) / caregiver    for 24 hours after surgery -- mother, Osborne Casco  Patient Special Instructions ----- n/a Pre-Op special Istructions ----- n/a Patient verbalized understanding of instructions that were given at this phone interview. Patient denies shortness of breath, chest pain, fever, cough at this phone interview.

## 2020-10-07 ENCOUNTER — Ambulatory Visit (HOSPITAL_BASED_OUTPATIENT_CLINIC_OR_DEPARTMENT_OTHER): Payer: 59 | Admitting: Anesthesiology

## 2020-10-07 ENCOUNTER — Ambulatory Visit (HOSPITAL_BASED_OUTPATIENT_CLINIC_OR_DEPARTMENT_OTHER)
Admission: RE | Admit: 2020-10-07 | Discharge: 2020-10-07 | Disposition: A | Discharge status: Home / Self Care | Payer: 59 | Source: Ambulatory Visit | Attending: Urology | Admitting: Urology

## 2020-10-07 ENCOUNTER — Other Ambulatory Visit: Payer: Self-pay

## 2020-10-07 ENCOUNTER — Encounter (HOSPITAL_BASED_OUTPATIENT_CLINIC_OR_DEPARTMENT_OTHER): Payer: Self-pay | Admitting: Urology

## 2020-10-07 ENCOUNTER — Encounter (HOSPITAL_BASED_OUTPATIENT_CLINIC_OR_DEPARTMENT_OTHER)
Admission: RE | Disposition: A | Discharge status: Home / Self Care | Payer: Self-pay | Source: Ambulatory Visit | Attending: Urology

## 2020-10-07 DIAGNOSIS — Z79899 Other long term (current) drug therapy: Secondary | ICD-10-CM | POA: Diagnosis not present

## 2020-10-07 DIAGNOSIS — N362 Urethral caruncle: Secondary | ICD-10-CM | POA: Diagnosis present

## 2020-10-07 HISTORY — PX: CYSTOSCOPY WITH BIOPSY: SHX5122

## 2020-10-07 HISTORY — DX: Personal history of other infectious and parasitic diseases: Z86.19

## 2020-10-07 HISTORY — DX: Dermatitis, unspecified: L30.9

## 2020-10-07 HISTORY — DX: Presence of spectacles and contact lenses: Z97.3

## 2020-10-07 SURGERY — CYSTOSCOPY, WITH BIOPSY
Anesthesia: General | Site: Renal

## 2020-10-07 MED ORDER — LIDOCAINE 2% (20 MG/ML) 5 ML SYRINGE
INTRAMUSCULAR | Status: AC
  Filled 2020-10-07: qty 5

## 2020-10-07 MED ORDER — OXYCODONE HCL 5 MG/5ML PO SOLN: 5.0000 mg | Freq: Once | ORAL | Status: AC | PRN

## 2020-10-07 MED ORDER — PROPOFOL 10 MG/ML IV BOLUS
INTRAVENOUS | Status: AC
  Filled 2020-10-07: qty 20

## 2020-10-07 MED ORDER — DEXAMETHASONE SODIUM PHOSPHATE 10 MG/ML IJ SOLN
INTRAMUSCULAR | Status: DC | PRN
  Administered 2020-10-07: 8 mg via INTRAVENOUS

## 2020-10-07 MED ORDER — CEFAZOLIN SODIUM-DEXTROSE 2-4 GM/100ML-% IV SOLN
2.0000 g | INTRAVENOUS | Status: AC
  Administered 2020-10-07: 2 g via INTRAVENOUS

## 2020-10-07 MED ORDER — SODIUM CHLORIDE 0.9% FLUSH: 3.0000 mL | Freq: Two times a day (BID) | INTRAVENOUS | Status: DC

## 2020-10-07 MED ORDER — MIDAZOLAM HCL 2 MG/2ML IJ SOLN
INTRAMUSCULAR | Status: AC
  Filled 2020-10-07: qty 2

## 2020-10-07 MED ORDER — HYDROCODONE-ACETAMINOPHEN 5-325 MG PO TABS: 1.0000 | ORAL_TABLET | Freq: Four times a day (QID) | ORAL | 0 refills | Status: DC | PRN

## 2020-10-07 MED ORDER — FENTANYL CITRATE (PF) 100 MCG/2ML IJ SOLN
INTRAMUSCULAR | Status: DC | PRN
  Administered 2020-10-07: 50 ug via INTRAVENOUS

## 2020-10-07 MED ORDER — EPHEDRINE SULFATE-NACL 50-0.9 MG/10ML-% IV SOSY
PREFILLED_SYRINGE | INTRAVENOUS | Status: DC | PRN
  Administered 2020-10-07 (×2): 10 mg via INTRAVENOUS

## 2020-10-07 MED ORDER — AMISULPRIDE (ANTIEMETIC) 5 MG/2ML IV SOLN: 10.0000 mg | Freq: Once | INTRAVENOUS | Status: DC | PRN

## 2020-10-07 MED ORDER — STERILE WATER FOR IRRIGATION IR SOLN
Status: DC | PRN
  Administered 2020-10-07: 3000 mL

## 2020-10-07 MED ORDER — FENTANYL CITRATE (PF) 100 MCG/2ML IJ SOLN
INTRAMUSCULAR | Status: AC
  Filled 2020-10-07: qty 2

## 2020-10-07 MED ORDER — OXYCODONE HCL 5 MG PO TABS
ORAL_TABLET | ORAL | Status: AC
  Filled 2020-10-07: qty 1

## 2020-10-07 MED ORDER — PROPOFOL 10 MG/ML IV BOLUS
INTRAVENOUS | Status: DC | PRN
  Administered 2020-10-07: 30 mg via INTRAVENOUS
  Administered 2020-10-07: 170 mg via INTRAVENOUS

## 2020-10-07 MED ORDER — DEXAMETHASONE SODIUM PHOSPHATE 10 MG/ML IJ SOLN
INTRAMUSCULAR | Status: AC
  Filled 2020-10-07: qty 1

## 2020-10-07 MED ORDER — CEFAZOLIN SODIUM-DEXTROSE 2-4 GM/100ML-% IV SOLN
INTRAVENOUS | Status: AC
  Filled 2020-10-07: qty 100

## 2020-10-07 MED ORDER — ONDANSETRON HCL 4 MG/2ML IJ SOLN
INTRAMUSCULAR | Status: DC | PRN
  Administered 2020-10-07: 4 mg via INTRAVENOUS

## 2020-10-07 MED ORDER — OXYCODONE HCL 5 MG PO TABS
5.0000 mg | ORAL_TABLET | Freq: Once | ORAL | Status: AC | PRN
  Administered 2020-10-07: 5 mg via ORAL

## 2020-10-07 MED ORDER — EPHEDRINE 5 MG/ML INJ
INTRAVENOUS | Status: AC
  Filled 2020-10-07: qty 10

## 2020-10-07 MED ORDER — PHENYLEPHRINE 40 MCG/ML (10ML) SYRINGE FOR IV PUSH (FOR BLOOD PRESSURE SUPPORT)
PREFILLED_SYRINGE | INTRAVENOUS | Status: DC | PRN
  Administered 2020-10-07: 120 ug via INTRAVENOUS

## 2020-10-07 MED ORDER — FENTANYL CITRATE (PF) 100 MCG/2ML IJ SOLN: 25.0000 ug | INTRAMUSCULAR | Status: DC | PRN

## 2020-10-07 MED ORDER — LACTATED RINGERS IV SOLN: INTRAVENOUS | Status: DC

## 2020-10-07 MED ORDER — ONDANSETRON HCL 4 MG/2ML IJ SOLN
INTRAMUSCULAR | Status: AC
  Filled 2020-10-07: qty 2

## 2020-10-07 MED ORDER — PHENYLEPHRINE 40 MCG/ML (10ML) SYRINGE FOR IV PUSH (FOR BLOOD PRESSURE SUPPORT)
PREFILLED_SYRINGE | INTRAVENOUS | Status: AC
  Filled 2020-10-07: qty 10

## 2020-10-07 MED ORDER — LIDOCAINE 2% (20 MG/ML) 5 ML SYRINGE
INTRAMUSCULAR | Status: DC | PRN
  Administered 2020-10-07: 100 mg via INTRAVENOUS

## 2020-10-07 MED ORDER — MIDAZOLAM HCL 5 MG/5ML IJ SOLN
INTRAMUSCULAR | Status: DC | PRN
  Administered 2020-10-07: 2 mg via INTRAVENOUS

## 2020-10-07 MED ORDER — MEPERIDINE HCL 25 MG/ML IJ SOLN: 6.2500 mg | INTRAMUSCULAR | Status: DC | PRN

## 2020-10-07 SURGICAL SUPPLY — 23 items
BAG DRAIN URO-CYSTO SKYTR STRL (DRAIN) ×2 IMPLANT
BAG DRN UROCATH (DRAIN) ×1
BAG URINE LEG 500ML (DRAIN) ×2 IMPLANT
CATH FOLEY 2W COUNCIL 5CC 16FR (CATHETERS) ×2 IMPLANT
CATH FOLEY 2WAY SLVR  5CC 16FR (CATHETERS)
CATH FOLEY 2WAY SLVR 5CC 16FR (CATHETERS) IMPLANT
CLOTH BEACON ORANGE TIMEOUT ST (SAFETY) ×2 IMPLANT
ELECT REM PT RETURN 9FT ADLT (ELECTROSURGICAL) ×2
ELECTRODE REM PT RTRN 9FT ADLT (ELECTROSURGICAL) ×1 IMPLANT
GLOVE SURG POLYISO LF SZ8 (GLOVE) ×2 IMPLANT
GLOVE SURG UNDER POLY LF SZ7 (GLOVE) ×2 IMPLANT
GOWN STRL REUS W/TWL LRG LVL3 (GOWN DISPOSABLE) ×4 IMPLANT
GUIDEWIRE STR DUAL SENSOR (WIRE) ×2 IMPLANT
KIT TURNOVER CYSTO (KITS) ×2 IMPLANT
MANIFOLD NEPTUNE II (INSTRUMENTS) ×2 IMPLANT
NDL SAFETY ECLIPSE 18X1.5 (NEEDLE) IMPLANT
NEEDLE HYPO 18GX1.5 SHARP (NEEDLE)
NEEDLE HYPO 22GX1.5 SAFETY (NEEDLE) IMPLANT
NS IRRIG 500ML POUR BTL (IV SOLUTION) IMPLANT
PACK CYSTO (CUSTOM PROCEDURE TRAY) ×2 IMPLANT
SYR 20ML LL LF (SYRINGE) IMPLANT
TUBE CONNECTING 12X1/4 (SUCTIONS) ×2 IMPLANT
WATER STERILE IRR 3000ML UROMA (IV SOLUTION) ×2 IMPLANT

## 2020-10-07 NOTE — Anesthesia Preprocedure Evaluation (Addendum)
Anesthesia Evaluation  Patient identified by MRN, date of birth, ID band Patient awake    Reviewed: Allergy & Precautions, NPO status , Patient's Chart, lab work & pertinent test results  Airway Mallampati: II  TM Distance: >3 FB Neck ROM: Full    Dental no notable dental hx. (+) Dental Advisory Given, Teeth Intact   Pulmonary former smoker,    Pulmonary exam normal breath sounds clear to auscultation       Cardiovascular negative cardio ROS Normal cardiovascular exam Rhythm:Regular Rate:Normal     Neuro/Psych negative neurological ROS     GI/Hepatic negative GI ROS, Neg liver ROS,   Endo/Other  negative endocrine ROS  Renal/GU negative Renal ROS     Musculoskeletal negative musculoskeletal ROS (+)   Abdominal   Peds  Hematology negative hematology ROS (+)   Anesthesia Other Findings   Reproductive/Obstetrics                            Anesthesia Physical Anesthesia Plan  ASA: II  Anesthesia Plan: General   Post-op Pain Management:    Induction: Intravenous  PONV Risk Score and Plan: 3 and Ondansetron, Dexamethasone, Midazolam, Treatment may vary due to age or medical condition and Diphenhydramine  Airway Management Planned: LMA  Additional Equipment:   Intra-op Plan:   Post-operative Plan: Extubation in OR  Informed Consent: I have reviewed the patients History and Physical, chart, labs and discussed the procedure including the risks, benefits and alternatives for the proposed anesthesia with the patient or authorized representative who has indicated his/her understanding and acceptance.     Dental advisory given  Plan Discussed with: CRNA  Anesthesia Plan Comments:        Anesthesia Quick Evaluation

## 2020-10-07 NOTE — Anesthesia Postprocedure Evaluation (Signed)
Anesthesia Post Note  Patient: Wesley Joseph  Procedure(s) Performed: CYSTOSCOPY WITH BIOPSY AND FULGURATION (N/A Renal)     Patient location during evaluation: PACU Anesthesia Type: General Level of consciousness: sedated and patient cooperative Pain management: pain level controlled Vital Signs Assessment: post-procedure vital signs reviewed and stable Respiratory status: spontaneous breathing Cardiovascular status: stable Anesthetic complications: no   No complications documented.  Last Vitals:  Vitals:   10/07/20 1200 10/07/20 1315  BP:  110/70  Pulse: 79 78  Resp: 14 16  Temp:  36.7 C  SpO2: 100% 100%    Last Pain:  Vitals:   10/07/20 1157  TempSrc: Oral  PainSc:                  Lewie Loron

## 2020-10-07 NOTE — Anesthesia Procedure Notes (Signed)
Procedure Name: LMA Insertion Date/Time: 10/07/2020 10:08 AM Performed by: Briant Sites, CRNA Pre-anesthesia Checklist: Patient identified, Emergency Drugs available, Suction available and Patient being monitored Patient Re-evaluated:Patient Re-evaluated prior to induction Oxygen Delivery Method: Circle system utilized Preoxygenation: Pre-oxygenation with 100% oxygen Induction Type: IV induction Ventilation: Mask ventilation without difficulty LMA: LMA inserted LMA Size: 4.0 Number of attempts: 1 Airway Equipment and Method: Bite block Placement Confirmation: positive ETCO2 Tube secured with: Tape Dental Injury: Teeth and Oropharynx as per pre-operative assessment

## 2020-10-07 NOTE — Discharge Instructions (Addendum)
Post Anesthesia Home Care Instructions  Activity: Get plenty of rest for the remainder of the day. A responsible adult should stay with you for 24 hours following the procedure.  For the next 24 hours, DO NOT: -Drive a car -Advertising copywriter -Drink alcoholic beverages -Take any medication unless instructed by your physician -Make any legal decisions or sign important papers.  Meals: Start with liquid foods such as gelatin or soup. Progress to regular foods as tolerated. Avoid greasy, spicy, heavy foods. If nausea and/or vomiting occur, drink only clear liquids until the nausea and/or vomiting subsides. Call your physician if vomiting continues.  Special Instructions/Symptoms: Your throat may feel dry or sore from the anesthesia or the breathing tube placed in your throat during surgery. If this causes discomfort, gargle with warm salt water. The discomfort should disappear within 24 hours.  If you had a scopolamine patch placed behind your ear for the management of post- operative nausea and/or vomiting:  1. The medication in the patch is effective for 72 hours, after which it should be removed.  Wrap patch in a tissue and discard in the trash. Wash hands thoroughly with soap and water. 2. You may remove the patch earlier than 72 hours if you experience unpleasant side effects which may include dry mouth, dizziness or visual disturbances. 3. Avoid touching the patch. Wash your hands with soap and water after contact with the patch.   CYSTOSCOPY HOME CARE INSTRUCTIONS  Activity: Rest for the remainder of the day.  Do not drive or operate equipment today.  You may resume normal activities in one to two days as instructed by your physician.   Meals: Drink plenty of liquids and eat light foods such as gelatin or soup this evening.  You may return to a normal meal plan tomorrow.  Return to Work: You may return to work in one to two days or as instructed by your physician.  Special  Instructions / Symptoms: Call your physician if any of these symptoms occur:   -persistent or heavy bleeding  -bleeding which continues after first few urination  -large blood clots that are difficult to pass  -urine stream diminishes or stops completely  -fever equal to or higher than 101 degrees Farenheit.  -cloudy urine with a strong, foul odor  -severe pain  Females should always wipe from front to back after elimination.  You may feel some burning pain when you urinate.  This should disappear with time.  Applying moist heat to the lower abdomen or a hot tub bath may help relieve the pain. \  You may remove the catheter at home in the morning by cutting of the orange nipple.  The catheter should slide out easily one the fluid drains.  If you don't feel comfortable doing this, please call the office Monday morning to come have it removed.   Indwelling Urinary Catheter Care, Adult An indwelling urinary catheter is a thin, flexible, germ-free (sterile) tube that is placed into the bladder to help drain urine out of the body. The catheter is inserted into the part of the body that drains urine from the bladder (urethra). Urine drains from the catheter into a drainage bag outside of the body. Taking good care of your catheter will keep it working properly and help to prevent problems from developing. What are the risks?  Bacteria may get into your bladder and cause a urinary tract infection.  Urine flow can become blocked. This can happen if the catheter is not working  correctly, or if you have sediment or a blood clot in your bladder or the catheter.  Tissue near the catheter may become irritated and bleed. How to wear your catheter and your drainage bag Supplies needed  Adhesive tape or a leg strap.  Alcohol wipe or soap and water (if you use tape).  A clean towel (if you use tape).  Overnight drainage bag.  Smaller drainage bag (leg bag). Wearing your catheter and bag Use  adhesive tape or a leg strap to attach your catheter to your leg.  Make sure the catheter is not pulled tight.  If a leg strap gets wet, replace it with a dry one.  If you use adhesive tape: 1. Use an alcohol wipe or soap and water to wash off any stickiness on your skin where you had tape before. 2. Use a clean towel to pat-dry the area. 3. Apply the new tape. You should have received a large overnight drainage bag and a smaller leg bag that fits underneath clothing.  You may wear the overnight bag at any time, but you should not wear the leg bag at night.  Always wear the leg bag below your knee.  Make sure the overnight drainage bag is always lower than the level of your bladder, but do not let it touch the floor. Before you go to sleep, hang the bag inside a wastebasket that is covered by a clean plastic bag. How to care for your skin around the catheter Supplies needed  A clean washcloth.  Water and mild soap.  A clean towel. Caring for your skin and catheter  Every day, use a clean washcloth and soapy water to clean the skin around your catheter. 1. Wash your hands with soap and water. 2. Wet a washcloth in warm water and mild soap. 3. Clean the skin around your urethra.  If you are male:  Use one hand to gently spread the folds of skin around your vagina (labia).  With the washcloth in your other hand, wipe the inner side of your labia on each side. Do this in a front-to-back direction.  If you are male:  Use one hand to pull back any skin that covers the end of your penis (foreskin).  With the washcloth in your other hand, wipe your penis in small circles. Start wiping at the tip of your penis, then move outward from the catheter.  Move the foreskin back in place, if this applies. 4. With your free hand, hold the catheter close to where it enters your body. Keep holding the catheter during cleaning so it does not get pulled out. 5. Use your other hand to clean  the catheter with the washcloth.  Only wipe downward on the catheter.  Do not wipe upward toward your body, because that may push bacteria into your urethra and cause infection. 6. Use a clean towel to pat-dry the catheter and the skin around it. Make sure to wipe off all soap. 7. Wash your hands with soap and water.  Shower every day. Do not take baths.  Do not use cream, ointment, or lotion on the area where the catheter enters your body, unless your health care provider tells you to do that.  Do not use powders, sprays, or lotions on your genital area.  Check your skin around the catheter every day for signs of infection. Check for: ? Redness, swelling, or pain. ? Fluid or blood. ? Warmth. ? Pus or a bad smell.  How to empty the drainage bag Supplies needed  Rubbing alcohol.  Gauze pad or cotton ball.  Adhesive tape or a leg strap. Emptying the bag Empty your drainage bag (your overnight drainage bag or your leg bag) when it is ?- full, or at least 2-3 times a day. Clean the drainage bag according to the manufacturer's instructions or as told by your health care provider. 1. Wash your hands with soap and water. 2. Detach the drainage bag from your leg. 3. Hold the drainage bag over the toilet or a clean container. Make sure the drainage bag is lower than your hips and bladder. This stops urine from going back into the tubing and into your bladder. 4. Open the pour spout at the bottom of the bag. 5. Empty the urine into the toilet or container. Do not let the pour spout touch any surface. This precaution is important to prevent bacteria from getting in the bag and causing infection. 6. Apply rubbing alcohol to a gauze pad or cotton ball. 7. Use the gauze pad or cotton ball to clean the pour spout. 8. Close the pour spout. 9. Attach the bag to your leg with adhesive tape or a leg strap. 10. Wash your hands with soap and water. How to change the drainage bag Supplies  needed:  Alcohol wipes.  A clean drainage bag.  Adhesive tape or a leg strap. Changing the bag Replace your drainage bag with a clean bag if it leaks, starts to smell bad, or looks dirty. 1. Wash your hands with soap and water. 2. Detach the dirty drainage bag from your leg. 3. Pinch the catheter with your fingers so that urine does not spill out. 4. Disconnect the catheter tube from the drainage tube at the connection valve. Do not let the tubes touch any surface. 5. Clean the end of the catheter tube with an alcohol wipe. Use a different alcohol wipe to clean the end of the drainage tube. 6. Connect the catheter tube to the drainage tube of the clean bag. 7. Attach the clean bag to your leg with adhesive tape or a leg strap. Avoid attaching the new bag too tightly. 8. Wash your hands with soap and water. General instructions  Never pull on your catheter or try to remove it. Pulling can damage your internal tissues.  Always wash your hands before and after you handle your catheter or drainage bag. Use a mild, fragrance-free soap. If soap and water are not available, use hand sanitizer.  Always make sure there are no twists or bends (kinks) in the catheter tube.  Always make sure there are no leaks in the catheter or drainage bag.  Drink enough fluid to keep your urine pale yellow.  Do not take baths, swim, or use a hot tub.  If you are male, wipe from front to back after having a bowel movement.   Contact a health care provider if:  Your urine is cloudy.  Your urine smells unusually bad.  Your catheter gets clogged.  Your catheter starts to leak.  Your bladder feels full. Get help right away if:  You have redness, swelling, or pain where the catheter enters your body.  You have fluid, blood, pus, or a bad smell coming from the area where the catheter enters your body.  The area where the catheter enters your body feels warm to the touch.  You have a fever.  You  have pain in your abdomen, legs, lower back, or bladder.  You see blood in the catheter.  Your urine is pink or red.  You have nausea, vomiting, or chills.  Your urine is not draining into the bag.  Your catheter gets pulled out. Summary  An indwelling urinary catheter is a thin, flexible, germ-free (sterile) tube that is placed into the bladder to help drain urine out of the body.  The catheter is inserted into the part of the body that drains urine from the bladder (urethra).  Take good care of your catheter to keep it working properly and help prevent problems from developing.  Always wash your hands before and after you handle your catheter or drainage bag.  Never pull on your catheter or try to remove it. This information is not intended to replace advice given to you by your health care provider. Make sure you discuss any questions you have with your health care provider. Document Revised: 09/07/2019 Document Reviewed: 02/15/2017 Elsevier Patient Education  2021 [employer].

## 2020-10-07 NOTE — Transfer of Care (Signed)
Immediate Anesthesia Transfer of Care Note  Patient: Wesley Joseph  Procedure(s) Performed: CYSTOSCOPY WITH BIOPSY AND FULGURATION (N/A )  Patient Location: PACU  Anesthesia Type:General  Level of Consciousness: awake, alert  and oriented  Airway & Oxygen Therapy: Patient Spontanous Breathing and Patient connected to nasal cannula oxygen  Post-op Assessment: Report given to RN  Post vital signs: Reviewed and stable  Last Vitals:  Vitals Value Taken Time  BP 110/70   Temp    Pulse 81 10/07/20 1053  Resp 14 10/07/20 1053  SpO2 100 % 10/07/20 1053  Vitals shown include unvalidated device data.  Last Pain:  Vitals:   10/07/20 0843  TempSrc: Oral  PainSc: 0-No pain      Patients Stated Pain Goal: 5 (10/07/20 0843)  Complications: No complications documented.

## 2020-10-07 NOTE — Op Note (Signed)
Procedure: Cystoscopy with biopsies of bulbar and prostatic urethra and fulguration.  Preop diagnosis: History of hematuria with papillary lesions of the bulbar urethra and prostatic urethra with a history of perianal condyloma.  Postop diagnosis: Same.  Surgeon: Dr. Bjorn Pippin.  Anesthesia: General.  Specimen: Biopsies from the right and left prostatic apical urethra and from the bulbar urethra.  Drains: 16 French Councill catheter.  EBL: 1 mL.  Complications: None.  Indications: Wesley Joseph is a 30 year old male who was seen initially for a history of gross hematuria.  Cystoscopy demonstrated some small papillary lesions of the proximal 2 cm of the bulbar urethra and bilaterally at the prostatic apex which were felt to be concerning for possible condyloma or other neoplasm.  He has a past history of perianal condyloma that were treated.  Procedure: He was given 2 g of Ancef.  A general anesthetic was induced.  He was placed in lithotomy position and fitted with PAS hose.  His perineum and genitalia were prepped with Betadine solution and he was draped in usual sterile fashion.  Cystoscopy was performed using a 21 [place] scope and 30 degree lens.  Examination revealed a normal anterior and distal bulbar urethra.  In the proximal 2 cm of the bulbar urethra there were some patchy areas of low papillary lesions that in some views had a slightly yellowish cast.  They were circumferential in this area of the urethra.  The external sphincter was intact without as much of the papillary change in the membranous urethra, but in the prostatic urethra apically on both sides and anteriorly were more of the papillary changes and a more continuous pattern.  The proximal prostatic urethra was unremarkable but he did have a slightly high bladder neck.  Examination of bladder revealed mild trabeculation without tumors, stones or inflammation.  Ureteral orifices were unremarkable.  After initial cystoscopy a cup  biopsy forceps was used to obtain biopsies from the right and left apical prostatic urethra and a biopsy was obtained from the right anterior proximal bulb.  After completion of the biopsies a Bugbee electrode was used to fulgurate the biopsy site sites.  A few additional areas were fulgurated but I did not feel comfortable aggressively fulgurating the entire lesion as I was concerned that this would predispose him to a high risk of urethral stricture disease and that if he does have condyloma that further therapy, potentially with Efudex.  Would be indicated.  Because of the thin nature of the urethral mucosa a bit of a flap was raised with the biopsy so I felt that a Foley catheter overnight was worthwhile.  An initial attempt at passing a 74 [place] Foley was unsuccessful because of the the flap.  So the cystoscope was reinserted and a sensor wire was advanced into the bladder.  A 16 French Councill catheter was placed over the wire after the cystoscope was removed.  The balloon was filled with 10 mL of sterile fluid.  The catheter was placed to leg bag drainage.  He was taken down from lithotomy position, his anesthetic was reversed and he was moved recovery in stable condition.  There were no complications.

## 2020-10-07 NOTE — H&P (Signed)
I have blood in my urine.     3/2/22Jill Alexanders returns for cystoscopy. His CT was negative. He has had no further hematuria or hematospermia.   Wesley Joseph is a 30 yo male who was sent for an episode of gross hematuria in October followed by hematospermia. STD testing was negative. He has been on doxycycline, bactrim, cipro and azithromycin over the last 2-3 months. He has been off of the anitbiotics for about 2 weeks. He only saw small amounts of blood in the urine for a few episodes in October and then resolved but he still has some brown semen. He has some frequency but he is hydrating well but no other voiding complaints. His UA today is clear. he has never had stones. He has had rectal warts but no penile or meatal warts. He has had some mild infrequent intermittent right groin pain.    ALLERGIES: None   MEDICATIONS: Bc Pain Relief  Diazepam 10 mg tablet 1-2 tablet PO Daily Take one hour prior to procedure.  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 I reviewed the risks and anesthetic options and will send in diazepam for the procedure. , - 07/18/2020 Hematospermia, He thinks the hematospermia may be improving. His PSA was normal and his exam benign. I will monitor this and if it continues to be an issues we could consider a prostate [place] or MRI. - 07/18/2020    NON-GU PMH: Migraine, unspecified, not intractable, without status migrainosus    FAMILY HISTORY: None    Notes: No GU family history noted.    SOCIAL HISTORY: Marital Status: Single Preferred Language: English; Ethnicity: Not Hispanic Or Latino; Race: White Current Smoking Status: Patient has never smoked.   Tobacco Use Assessment Completed: Used Tobacco in last 30  days? Does not use smokeless tobacco. Does drink.  Drinks 3 caffeinated drinks per day. Patient's occupation Community education officer.    REVIEW OF SYSTEMS:    GU Review Male:   Patient denies frequent urination, hard to postpone urination, burning/ pain with urination, get up at night to urinate, leakage of urine, stream starts and stops, trouble starting your stream, have to strain to urinate , erection problems, and penile pain.  Gastrointestinal (Upper):   Patient denies nausea, vomiting, and indigestion/ heartburn.  Gastrointestinal (Lower):   Patient denies constipation and diarrhea.  Constitutional:   Patient denies fever, night sweats, weight loss, and fatigue.  Skin:   Patient denies skin rash/ lesion and itching.  Eyes:   Patient denies blurred vision and double vision.  Ears/ Nose/ Throat:   Patient denies sore throat and sinus problems.  Hematologic/Lymphatic:   Patient denies swollen glands and easy bruising.  Cardiovascular:   Patient denies leg swelling and chest pains.  Respiratory:   Patient denies cough and shortness of breath.  Endocrine:   Patient denies excessive thirst.  Musculoskeletal:   Patient denies back pain and joint pain.  Neurological:   Patient denies headaches and dizziness.  Psychologic:   Patient denies depression and anxiety.   VITAL SIGNS:      09/14/2020 08:05 AM  BP 129/72 mmHg  Pulse 82 /min  Temperature 98.4 F /  36.8 C   Complexity of Data:  X-Ray Review: C.T. Hematuria: Reviewed Films. Reviewed Report. Discussed With Patient.     PROCEDURES:         Flexible Cystoscopy - 52000  Risks, benefits, and some of the potential complications of the procedure were discussed. 52ml of 2% lidocaine jelly was instilled intraurethrally.  He was given Pronox for sedation. Cipro 500mg  given for antibiotic prophylaxis.     Meatus:  Normal size. Normal location. Normal condition.  Urethra:  No strictures. There are a few raised slightly  yellowish lesions of the proximal bulb that are worrisome for condyloma or neoplasm.  External Sphincter:  Normal.  Verumontanum:  Normal.  Prostate:  Mild hyperplasia. Non-obstructing. There are papillary mucosal changes of the apical prostate that are worrisome for condyloma or neoplasm.  Bladder Neck:  Non-obstructing.  Ureteral Orifices:  Normal location. Normal size. Normal shape. Effluxed clear urine.  Bladder:  No trabeculation. No tumors. Normal mucosa. No stones.      The procedure was well tolerated and there were no complications.         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 I will get him set up for biopsy and fulguration. Risks of bleeding, infection, urethral injury, incontinence, ejaculatory complications, need for secondary procedures, catheters or medical therapy, thrombotic events and anesthetic complications reviewed.   2   Gross hematuria - R31.0      PLAN:           Schedule Return Visit/Planned Activity: Next Available Appointment - Schedule Surgery

## 2020-10-10 ENCOUNTER — Encounter (HOSPITAL_BASED_OUTPATIENT_CLINIC_OR_DEPARTMENT_OTHER): Payer: Self-pay | Admitting: Urology

## 2020-10-12 LAB — SURGICAL PATHOLOGY

## 2020-10-18 ENCOUNTER — Ambulatory Visit: Payer: 59 | Admitting: Medical

## 2020-10-18 ENCOUNTER — Other Ambulatory Visit: Payer: Self-pay

## 2020-10-18 ENCOUNTER — Encounter: Payer: Self-pay | Admitting: Medical

## 2020-10-18 VITALS — BP 118/76 | HR 93 | Ht 67.0 in | Wt 145.4 lb

## 2020-10-18 DIAGNOSIS — R361 Hematospermia: Secondary | ICD-10-CM

## 2020-10-18 DIAGNOSIS — R310 Gross hematuria: Secondary | ICD-10-CM | POA: Diagnosis not present

## 2020-10-18 DIAGNOSIS — N362 Urethral caruncle: Secondary | ICD-10-CM | POA: Diagnosis not present

## 2020-10-18 DIAGNOSIS — Z79899 Other long term (current) drug therapy: Secondary | ICD-10-CM | POA: Diagnosis not present

## 2020-10-18 MED ORDER — EMTRICITABINE-TENOFOVIR DF 200-300 MG PO TABS: 1.0000 | ORAL_TABLET | Freq: Every day | ORAL | 1 refills | Status: DC

## 2020-10-18 NOTE — Progress Notes (Signed)
Subjective:  Wesley Joseph is a 30 y.o. male who presents for Chief Complaint  Patient presents with  . Follow-up    3 month follow up on prep      Here for Prep follow up.  He would like a refill on Truvada prep.  He has been taking to meet the prior to his recent surgery.  Last sexual activity September 2021 before he started having gross hematuria and hematospermia.  So no real concern for STD screening today  He did have recent urology consult for ongoing blood in the urine and blood in the sperm  He had a cystoscopy October 07, 2020 with fulguration of lesions and biopsies.  These biopsies were thought to be condylomata.  Biopsy showed urethral polyps.  He is still getting some blood clots in the morning when he urinates.  But for the most part of the hematospermia and frank hematuria with urine has improved.    He had a lot of pain the first week after his procedure.  Currently doing okay, improving  No other aggravating or relieving factors.    No other c/o.  The following portions of the patient's history were reviewed and updated as appropriate: allergies, current medications, past family history, past medical history, past social history, past surgical history and problem list.  ROS Otherwise as in subjective above  Objective: BP 118/76   Pulse 93   Ht 5\' 7"  (1.702 m)   Wt 145 lb 6.4 oz (66 kg)   SpO2 98%   BMI 22.77 kg/m   General appearance: alert, no distress, well developed, well nourished Otherwise not examined    Assessment: Encounter Diagnoses  Name Primary?  Urethral polyp Yes  . Gross hematuria   . Hematospermia   . High risk medication use      Plan: I reviewed the recent urology notes, procedure note from October 07, 2020, and he has follow-up with Dr. October 09, 2020 with urology this week.  The blood clots and urine issues are improving.    Continue Truvada prep.  Discussed routine screening, risks/benefits of medication.  F/u summer 2022 for fasting  labs and physical  Keith was seen today for follow-up.  Diagnoses and all orders for this visit:  Urethral polyp  Gross hematuria  Hematospermia  High risk medication use  Other orders -     emtricitabine-tenofovir (TRUVADA) 200-300 MG tablet; Take 1 tablet by mouth daily.    Follow up: summer 2022 for physical and fasting labs

## 2021-01-23 ENCOUNTER — Encounter: Payer: 59 | Admitting: Medical

## 2021-02-14 ENCOUNTER — Encounter: Payer: Self-pay | Admitting: Medical

## 2021-02-14 ENCOUNTER — Ambulatory Visit: Payer: 59 | Admitting: Medical

## 2021-02-14 ENCOUNTER — Other Ambulatory Visit: Payer: Self-pay

## 2021-02-14 VITALS — BP 120/70 | HR 61 | Ht 67.25 in | Wt 144.6 lb

## 2021-02-14 DIAGNOSIS — Z0000 Encounter for general adult medical examination without abnormal findings: Secondary | ICD-10-CM | POA: Diagnosis not present

## 2021-02-14 DIAGNOSIS — Z7185 Encounter for immunization safety counseling: Secondary | ICD-10-CM | POA: Diagnosis not present

## 2021-02-14 DIAGNOSIS — Z7252 High risk homosexual behavior: Secondary | ICD-10-CM | POA: Diagnosis not present

## 2021-02-14 DIAGNOSIS — Z299 Encounter for prophylactic measures, unspecified: Secondary | ICD-10-CM

## 2021-02-14 DIAGNOSIS — Z79899 Other long term (current) drug therapy: Secondary | ICD-10-CM

## 2021-02-14 NOTE — Progress Notes (Signed)
Subjective:   HPI  Wesley Joseph is a 30 y.o. male who presents for Chief Complaint  Patient presents with   fasting cpe    Fasting cpe no concerns     Patient Care Team: Elwood Bazinet, Leward Quan as PCP - General (Family Medicine) Sees dentist Sees eye doctor Dr. Irine Seal, urology   Concerns: None.  Seeing urology lately about urethral mass, urinary problems, s/p biopsies.  Reviewed their medical, surgical, family, social, medication, and allergy history and updated chart as appropriate.  Past Medical History:  Diagnosis Date   Eczema    History of condyloma acuminatum    Urethral lesion 09/2020   and prostatic lesion   Wears glasses     Past Surgical History:  Procedure Laterality Date   CYSTOSCOPY WITH BIOPSY N/A 10/07/2020   Procedure: CYSTOSCOPY WITH BIOPSY AND FULGURATION;  Surgeon: Irine Seal, MD;  Location: [facility];  Service: Urology;  Laterality: N/A;   HERNIA REPAIR  1992 (infant)   per pt possible right inguinal    WART FULGURATION N/A 02/07/2015   Procedure: FULGURATION ANAL CONDYLOMA;  Surgeon: Aviva Signs, MD;  Location: [facility];  Service: General;  Laterality: N/A;    Family History  Problem Relation Age of Onset   Hypertension Mother    Alzheimer's disease Maternal Grandmother    Diabetes Maternal Grandfather    Diabetes Paternal Grandfather    Heart disease Neg Hx    Cancer Neg Hx    Stroke Neg Hx      Current Outpatient Medications:    emtricitabine-tenofovir (TRUVADA) 200-300 MG tablet, Take 1 tablet by mouth daily., Disp: 90 tablet, Rfl: 1   Multiple Vitamins-Minerals (MULTIVITAMIN MEN PO), Take by mouth daily., Disp: , Rfl:    triamcinolone cream (KENALOG) 0.1 %, Apply 1 application topically 2 (two) times daily as needed (ACNE)., Disp: , Rfl:   No Known Allergies   Review of Systems Constitutional: -fever, -chills, -sweats, -unexpected weight change, -decreased appetite, -fatigue Allergy: -sneezing, -itching,  -congestion Dermatology: -changing moles, --rash, -lumps ENT: -runny nose, -ear pain, -sore throat, -hoarseness, -sinus pain, -teeth pain, - ringing in ears, -hearing loss, -nosebleeds Cardiology: -chest pain, -palpitations, -swelling, -difficulty breathing when lying flat, -waking up short of breath Respiratory: -cough, -shortness of breath, -difficulty breathing with exercise or exertion, -wheezing, -coughing up blood Gastroenterology: -abdominal pain, -nausea, -vomiting, -diarrhea, -constipation, -blood in stool, -changes in bowel movement, -difficulty swallowing or eating Hematology: -bleeding, -bruising  Musculoskeletal: -joint aches, -muscle aches, -joint swelling, -back pain, -neck pain, -cramping, -changes in gait Ophthalmology: denies vision changes, eye redness, itching, discharge Urology: -burning with urination, -difficulty urinating, -blood in urine, -urinary frequency, -urgency, -incontinence Neurology: -headache, -weakness, -tingling, -numbness, -memory loss, -falls, -dizziness Psychology: -depressed mood, -agitation, -sleep problems Male GU: no testicular mass, pain, no lymph nodes swollen, no swelling, no rash.  Depression screen [facility] 2/9 02/14/2021 01/26/2020 12/17/2016  Decreased Interest 0 0 0  Down, Depressed, Hopeless 0 0 0  PHQ - 2 Score 0 0 0        Objective:  BP 120/70   Pulse 61   Ht 5' 7.25" (1.708 m)   Wt 144 lb 9.6 oz (65.6 kg)   SpO2 98%   BMI 22.48 kg/m   General appearance: alert, no distress, WD/WN, Caucasian male Skin: unremarkable HEENT: normocephalic, conjunctiva/corneas normal, sclerae anicteric, PERRLA, EOMi, nares patent, no discharge or erythema, pharynx normal Oral cavity: MMM, tongue normal, teeth normal Neck: supple, no lymphadenopathy, no thyromegaly, no  masses, normal ROM, no bruits Chest: non tender, normal shape and expansion Heart: RRR, normal S1, S2, no murmurs Lungs: CTA bilaterally, no wheezes, rhonchi, or rales Abdomen: +bs, soft,  non tender, non distended, no masses, no hepatomegaly, no splenomegaly, no bruits Back: non tender, normal ROM, no scoliosis Musculoskeletal: upper extremities non tender, no obvious deformity, normal ROM throughout, lower extremities non tender, no obvious deformity, normal ROM throughout Extremities: no edema, no cyanosis, no clubbing Pulses: 2+ symmetric, upper and lower extremities, normal cap refill Neurological: alert, oriented x 3, CN2-12 intact, strength normal upper extremities and lower extremities, sensation normal throughout, DTRs 2+ throughout, no cerebellar signs, gait normal Psychiatric: normal affect, behavior normal, pleasant  GU/rectal - deferred to urology    Assessment and Plan :   Encounter Diagnoses  Name Primary?   Encounter for health maintenance examination in adult Yes   Vaccine counseling    Prophylactic measure    High risk homosexual behavior    High risk medication use     This visit was a preventative care visit, also known as wellness visit or routine physical.   Topics typically include healthy lifestyle, diet, exercise, preventative care, vaccinations, sick and well care, proper use of emergency dept and after hours care, as well as other concerns.     Recommendations: Continue to return yearly for your annual wellness and preventative care visits.  This gives [place] a chance to discuss healthy lifestyle, exercise, vaccinations, review your chart record, and perform screenings where appropriate.  I recommend you see your eye doctor yearly for routine vision care.  I recommend you see your dentist yearly for routine dental care including hygiene visits twice yearly.   Vaccination recommendations were reviewed Immunization History  Administered Date(s) Administered   DTaP 02/02/1993, 10/01/1994, 02/25/1996   HPV 9-valent 12/17/2016, 03/20/2017, 07/24/2017   Hepatitis B 09/30/1996, 11/16/1996, 10/05/2003   IPV 02/02/1993, 02/25/1996, 09/30/1996    Influenza,inj,Quad PF,6+ Mos 03/20/2017, 04/14/2018, 04/27/2020   MMR 10/10/1992, 09/30/1996   PFIZER(Purple Top)SARS-COV-2 Vaccination 09/25/2019, 10/16/2019   PPD Test 09/17/2009   Td 04/14/2016   Tdap 09/15/2009    Advised yearly flu shot    Screening for cancer: Colon cancer screening: Age 30  Testicular cancer screening You should do a monthly self testicular exam if you are between 45-28 years old  We discussed PSA, prostate exam, and prostate cancer screening risks/benefits.     Skin cancer screening: Check your skin regularly for new changes, growing lesions, or other lesions of concern Come in for evaluation if you have skin lesions of concern.  Lung cancer screening: If you have a greater than 20 pack year history of tobacco use, then you may qualify for lung cancer screening with a chest CT scan.   Please call your insurance company to inquire about coverage for this test.  We currently don't have screenings for other cancers besides breast, cervical, colon, and lung cancers.  If you have a strong family history of cancer or have other cancer screening concerns, please let me know.    Bone health: Get at least 150 minutes of aerobic exercise weekly Get weight bearing exercise at least once weekly Bone density test:  A bone density test is an imaging test that uses a type of X-ray to measure the amount of calcium and other minerals in your bones. The test may be used to diagnose or screen you for a condition that causes weak or thin bones (osteoporosis), predict your risk for a broken bone (  fracture), or determine how well your osteoporosis treatment is working. The bone density test is recommended for females 61 and older, or females or males <56 if certain risk factors such as thyroid disease, long term use of steroids such as for asthma or rheumatological issues, vitamin D deficiency, estrogen deficiency, family history of osteoporosis, self or family history of  fragility fracture in first degree relative.    Heart health: Get at least 150 minutes of aerobic exercise weekly Limit alcohol It is important to maintain a healthy blood pressure and healthy cholesterol numbers  Heart disease screening: Screening for heart disease includes screening for blood pressure, fasting lipids, glucose/diabetes screening, BMI height to weight ratio, reviewed of smoking status, physical activity, and diet.    Goals include blood pressure 120/80 or less, maintaining a healthy lipid/cholesterol profile, preventing diabetes or keeping diabetes numbers under good control, not smoking or using tobacco products, exercising most days per week or at least 150 minutes per week of exercise, and eating healthy variety of fruits and vegetables, healthy oils, and avoiding unhealthy food choices like fried food, fast food, high sugar and high cholesterol foods.    Other tests may possibly include EKG test, CT coronary calcium score, echocardiogram, exercise treadmill stress test.     Medical care options: I recommend you continue to seek care here first for routine care.  We try really hard to have available appointments Monday through Friday daytime hours for sick visits, acute visits, and physicals.  Urgent care should be used for after hours and weekends for significant issues that cannot wait till the next day.  The emergency department should be used for significant potentially life-threatening emergencies.  The emergency department is expensive, can often have long wait times for less significant concerns, so try to utilize primary care, urgent care, or telemedicine when possible to avoid unnecessary trips to the emergency department.  Virtual visits and telemedicine have been introduced since the pandemic started in 2020, and can be convenient ways to receive medical care.  We offer virtual appointments as well to assist you in a variety of options to seek medical  care.    Separate significant issues discussed: Urethral lesion - followed by urology, s/p biopsies.  Sees urology tomorrow in f/u.    C/t Truvada prep, routine STD screen today     Jatin was seen today for fasting cpe.  Diagnoses and all orders for this visit:  Encounter for health maintenance examination in adult -     Comprehensive metabolic panel -     CBC with Differential/Platelet -     RPR+HIV+GC+CT Panel  Vaccine counseling  Prophylactic measure -     RPR+HIV+GC+CT Panel  High risk homosexual behavior -     RPR+HIV+GC+CT Panel  High risk medication use -     RPR+HIV+GC+CT Panel   Follow-up pending labs, yearly for physical

## 2021-02-15 LAB — COMPREHENSIVE METABOLIC PANEL
ALT: 19 IU/L (ref 0–44)
AST: 27 IU/L (ref 0–40)
Albumin/Globulin Ratio: 3.4 — ABNORMAL HIGH (ref 1.2–2.2)
Albumin: 5.4 g/dL — ABNORMAL HIGH (ref 4.1–5.2)
Alkaline Phosphatase: 97 IU/L (ref 44–121)
BUN/Creatinine Ratio: 8 — ABNORMAL LOW (ref 9–20)
BUN: 8 mg/dL (ref 6–20)
Bilirubin Total: 1.5 mg/dL — ABNORMAL HIGH (ref 0.0–1.2)
CO2: 26 mmol/L (ref 20–29)
Calcium: 9.8 mg/dL (ref 8.7–10.2)
Chloride: 100 mmol/L (ref 96–106)
Creatinine, Ser: 1 mg/dL (ref 0.76–1.27)
Globulin, Total: 1.6 g/dL (ref 1.5–4.5)
Glucose: 88 mg/dL (ref 65–99)
Potassium: 4.4 mmol/L (ref 3.5–5.2)
Sodium: 140 mmol/L (ref 134–144)
Total Protein: 7 g/dL (ref 6.0–8.5)
eGFR: 104 mL/min/{1.73_m2} (ref >59)

## 2021-02-15 LAB — CBC WITH DIFFERENTIAL/PLATELET
Basophils Absolute: 0 10*3/uL (ref 0.0–0.2)
Basos: 1 %
EOS (ABSOLUTE): 0.1 10*3/uL (ref 0.0–0.4)
Eos: 3 %
Hematocrit: 46.2 % (ref 37.5–51.0)
Hemoglobin: 16.3 g/dL (ref 13.0–17.7)
Immature Grans (Abs): 0 10*3/uL (ref 0.0–0.1)
Immature Granulocytes: 0 %
Lymphocytes Absolute: 1.8 10*3/uL (ref 0.7–3.1)
Lymphs: 46 %
MCH: 31.9 pg (ref 26.6–33.0)
MCHC: 35.3 g/dL (ref 31.5–35.7)
MCV: 90 fL (ref 79–97)
Monocytes Absolute: 0.5 10*3/uL (ref 0.1–0.9)
Monocytes: 14 %
Neutrophils Absolute: 1.4 10*3/uL (ref 1.4–7.0)
Neutrophils: 36 %
Platelets: 184 10*3/uL (ref 150–450)
RBC: 5.11 x10E6/uL (ref 4.14–5.80)
RDW: 12.9 % (ref 11.6–15.4)
WBC: 3.8 10*3/uL (ref 3.4–10.8)

## 2021-02-15 LAB — RPR+HIV+GC+CT PANEL
Chlamydia trachomatis, NAA: NEGATIVE
HIV Screen 4th Generation wRfx: NONREACTIVE
Neisseria Gonorrhoeae by PCR: NEGATIVE
RPR Ser Ql: NONREACTIVE

## 2021-06-30 ENCOUNTER — Other Ambulatory Visit: Payer: Self-pay | Admitting: Medical

## 2021-11-01 NOTE — Progress Notes (Signed)
? ?Established Patient Office Visit ? ?Subjective:  ?Patient ID: Wesley Joseph, male    DOB: 1991/02/15  Age: 31 y.o. MRN: [id number] ? ?CC:  ?Chief Complaint  ?Patient presents with  ? Follow-up  ?  Follow up on prep- No other concerns  ? ? ?HPI ?Wesley Joseph presents for a follow up appointment; is tolerating Truvada well; denies any new issues. ? ?Outpatient Medications Prior to Visit  ?Medication Sig Dispense Refill  ? Multiple Vitamins-Minerals (MULTIVITAMIN MEN PO) Take by mouth daily.    ? triamcinolone cream (KENALOG) 0.1 % Apply 1 application topically 2 (two) times daily as needed (ACNE).    ? emtricitabine-tenofovir (TRUVADA) 200-300 MG tablet TAKE 1 TABLET BY MOUTH DAILY 90 tablet 1  ? ?No facility-administered medications prior to visit.  ? ? ?No Known Allergies ? ?ROS ?Review of Systems  ?Constitutional:  Negative for activity change, chills, fatigue and fever.  ?HENT:  Negative for congestion, ear pain, hearing loss and voice change.   ?Eyes:  Negative for pain and redness.  ?Respiratory:  Negative for cough and shortness of breath.   ?Cardiovascular:  Negative for leg swelling.  ?Gastrointestinal:  Negative for constipation, diarrhea, nausea and vomiting.  ?Endocrine: Negative for polyuria.  ?Genitourinary:  Negative for flank pain and frequency.  ?Musculoskeletal:  Negative for joint swelling and neck pain.  ?Skin:  Negative for rash.  ?Neurological:  Negative for dizziness.  ?Hematological:  Does not bruise/bleed easily.  ?Psychiatric/Behavioral:  Negative for agitation and behavioral problems.   ? ?  ?Objective:  ?  ?Physical Exam ?Vitals reviewed.  ?Constitutional:   ?   General: He is not in acute distress. ?   Appearance: Normal appearance.  ?HENT:  ?   Head: Normocephalic and atraumatic.  ?   Right Ear: External ear normal.  ?   Left Ear: External ear normal.  ?   Nose: No congestion.  ?Eyes:  ?   Extraocular Movements: Extraocular movements intact.  ?   Conjunctiva/sclera: Conjunctivae  normal.  ?   Pupils: Pupils are equal, round, and reactive to light.  ?Cardiovascular:  ?   Rate and Rhythm: Normal rate and regular rhythm.  ?   Pulses: Normal pulses.  ?   Heart sounds: Normal heart sounds.  ?Pulmonary:  ?   Effort: Pulmonary effort is normal.  ?   Breath sounds: Normal breath sounds. No wheezing.  ?Abdominal:  ?   General: Bowel sounds are normal.  ?   Palpations: Abdomen is soft.  ?Musculoskeletal:     ?   General: Normal range of motion.  ?   Cervical back: Normal range of motion.  ?   Right lower leg: No edema.  ?   Left lower leg: No edema.  ?Skin: ?   General: Skin is warm and dry.  ?Neurological:  ?   Mental Status: He is alert and oriented to person, place, and time.  ?Psychiatric:     ?   Mood and Affect: Mood normal.     ?   Behavior: Behavior normal.  ? ? ?BP 120/70   Pulse 76   Ht 5' 7.25" (1.708 m)   Wt 158 lb (71.7 kg)   SpO2 98%   BMI 24.56 kg/m?  ? ?Wt Readings from Last 3 Encounters:  ?11/02/21 158 lb (71.7 kg)  ?02/14/21 144 lb 9.6 oz (65.6 kg)  ?10/18/20 145 lb 6.4 oz (66 kg)  ? ? ?Results for orders placed or performed in visit on  11/02/21  ?RPR+HIV+GC+CT Panel  ?Result Value Ref Range  ? RPR Ser Ql Non Reactive Non Reactive  ? HIV Screen 4th Generation wRfx Non Reactive Non Reactive  ? Chlamydia trachomatis, NAA WILL FOLLOW   ? Neisseria Gonorrhoeae by PCR WILL FOLLOW   ?  ? ?Last CBC ?Lab Results  ?Component Value Date  ? WBC 3.8 02/14/2021  ? HGB 16.3 02/14/2021  ? HCT 46.2 02/14/2021  ? MCV 90 02/14/2021  ? MCH 31.9 02/14/2021  ? RDW 12.9 02/14/2021  ? PLT 184 02/14/2021  ? ?Last metabolic panel ?Lab Results  ?Component Value Date  ? GLUCOSE 88 02/14/2021  ? NA 140 02/14/2021  ? K 4.4 02/14/2021  ? CL 100 02/14/2021  ? CO2 26 02/14/2021  ? BUN 8 02/14/2021  ? CREATININE 1.00 02/14/2021  ? EGFR 104 02/14/2021  ? CALCIUM 9.8 02/14/2021  ? PROT 7.0 02/14/2021  ? ALBUMIN 5.4 (H) 02/14/2021  ? LABGLOB 1.6 02/14/2021  ? AGRATIO 3.4 (H) 02/14/2021  ? BILITOT 1.5 (H)  02/14/2021  ? ALKPHOS 97 02/14/2021  ? AST 27 02/14/2021  ? ALT 19 02/14/2021  ? ANIONGAP 9 02/01/2015  ? ?  ? ?The ASCVD Risk score (Arnett DK, et al., 2019) failed to calculate for the following reasons: ?  The 2019 ASCVD risk score is only valid for ages 79 to 3 ? ?  ?Assessment & Plan:  ? ?Problem List Items Addressed This Visit   ? ?  ? Other  ? High risk sexual behavior - Primary  ?  Stable, use condoms with all sexual contact to decrease sexually transmitted infection risk ? ?  ?  ? Relevant Orders  ? RPR+HIV+GC+CT Panel (Completed)  ? High risk medication use  ?  Stable, will monitor ? ?  ?  ? Relevant Orders  ? RPR+HIV+GC+CT Panel (Completed)  ? ? ?Meds ordered this encounter  ?Medications  ? emtricitabine-tenofovir (TRUVADA) 200-300 MG tablet  ?  Sig: Take 1 tablet by mouth daily.  ?  Dispense:  90 tablet  ?  Refill:  0  ?  Order Specific Question:   Supervising Provider  ?  Answer:   Denita Lung [[id number]]  ? ? ?Follow-up: Return for Return as Already Scheduled.  ? ? ?Irene Pap, PA-C ?

## 2021-11-02 ENCOUNTER — Encounter: Payer: Self-pay | Admitting: Physician Assistant

## 2021-11-02 ENCOUNTER — Ambulatory Visit: Payer: 59 | Admitting: Physician Assistant

## 2021-11-02 VITALS — BP 120/70 | HR 76 | Ht 67.25 in | Wt 158.0 lb

## 2021-11-02 DIAGNOSIS — Z79899 Other long term (current) drug therapy: Secondary | ICD-10-CM

## 2021-11-02 DIAGNOSIS — Z7252 High risk homosexual behavior: Secondary | ICD-10-CM | POA: Diagnosis not present

## 2021-11-02 MED ORDER — EMTRICITABINE-TENOFOVIR DF 200-300 MG PO TABS: 1.0000 | ORAL_TABLET | Freq: Every day | ORAL | 0 refills | Status: DC

## 2021-11-03 NOTE — Assessment & Plan Note (Signed)
Stable, use condoms with all sexual contact to decrease sexually transmitted infection risk ?

## 2021-11-03 NOTE — Assessment & Plan Note (Signed)
Stable, will monitor 

## 2021-11-04 LAB — RPR+HIV+GC+CT PANEL
Chlamydia trachomatis, NAA: NEGATIVE
HIV Screen 4th Generation wRfx: NONREACTIVE
Neisseria Gonorrhoeae by PCR: NEGATIVE
RPR Ser Ql: NONREACTIVE

## 2021-12-23 IMAGING — US US ABDOMEN LIMITED
1 series · 14 of 25 positions shown · non-contrast
Comparison: None.

CLINICAL DATA: Initial evaluation for elevated LFTs.

EXAM:
ULTRASOUND ABDOMEN LIMITED RIGHT UPPER QUADRANT

[Series 1: us abdomen limited · 14 of 62 slices shown]
[im 1/62]
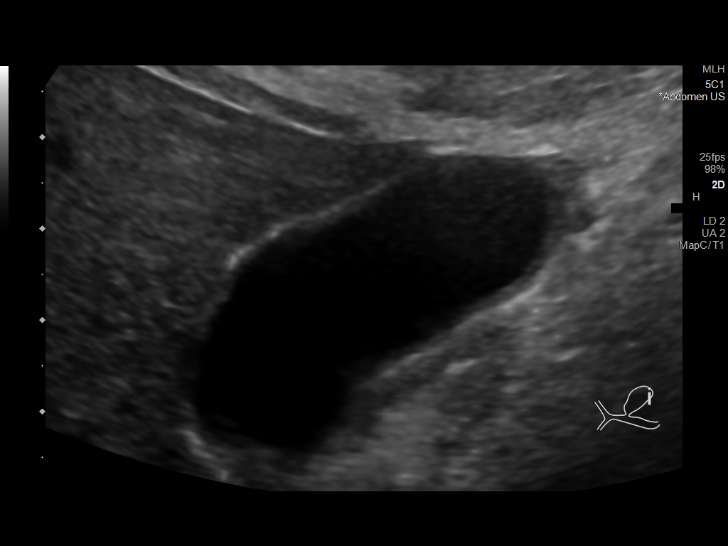
[im 6/62]
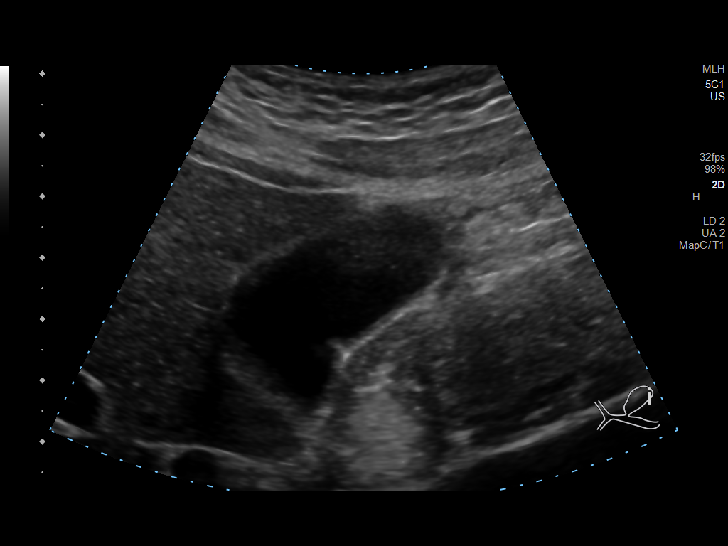
[im 11/62]
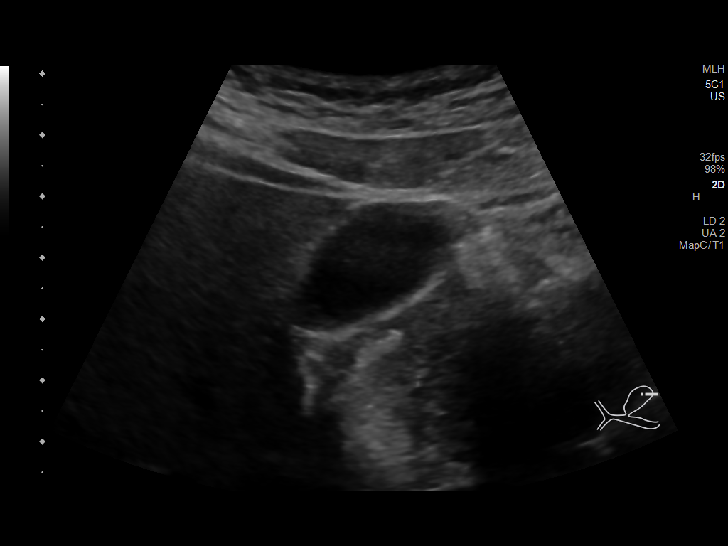
[im 16/62]
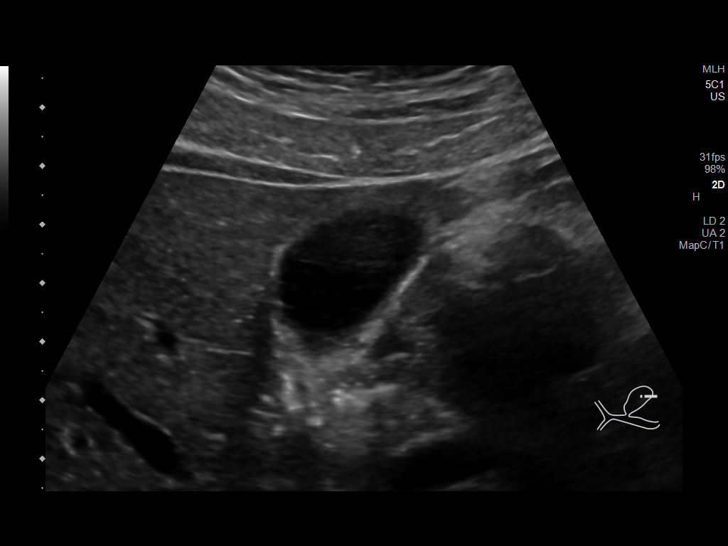
[im 21/62]
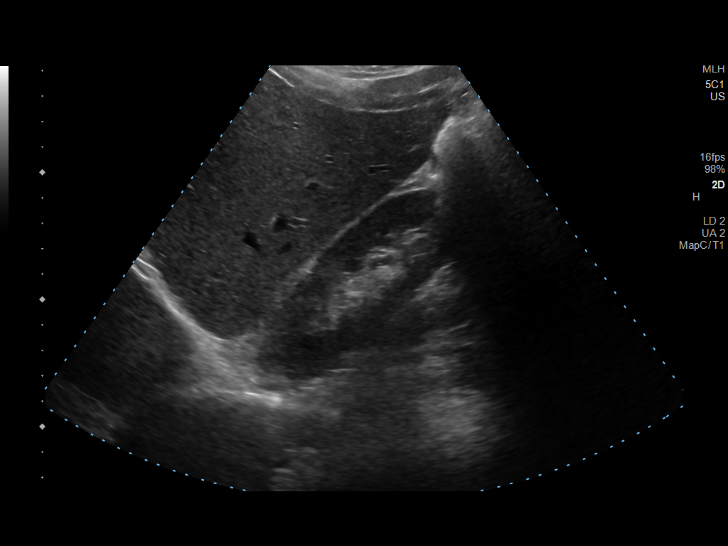
[im 23/62]
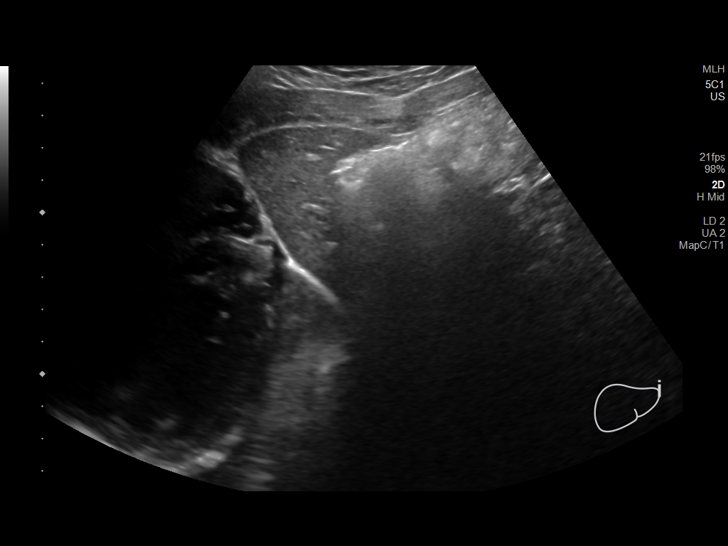
[im 28/62]
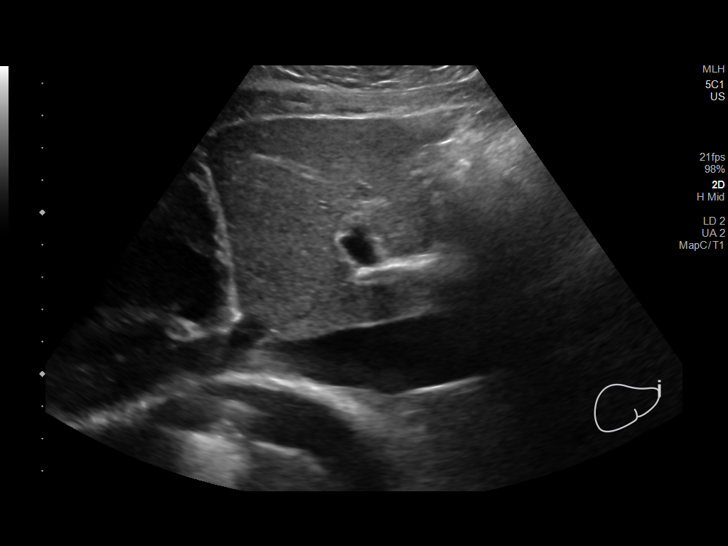
[im 34/62]
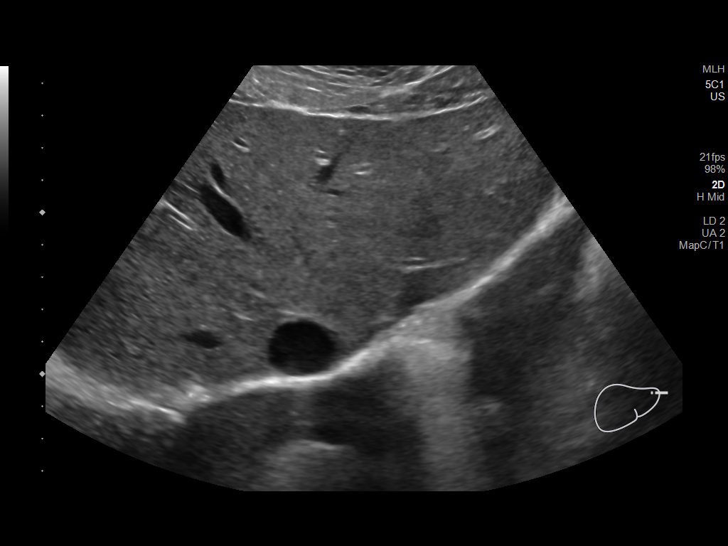
[im 39/62]
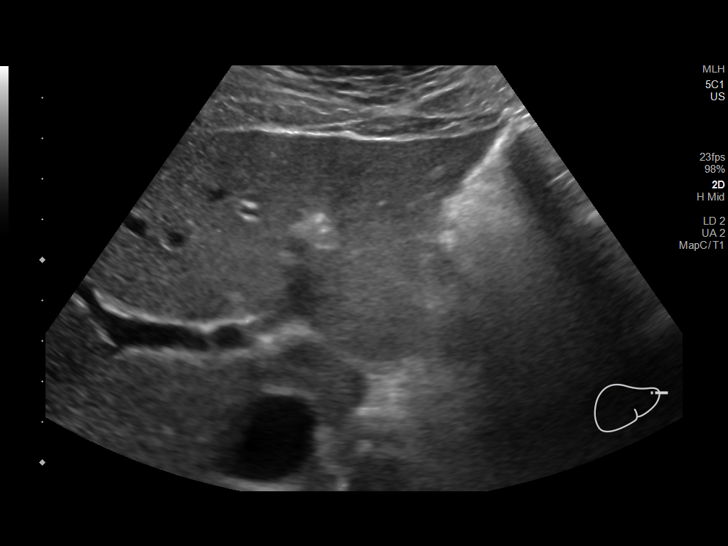
[im 41/62]
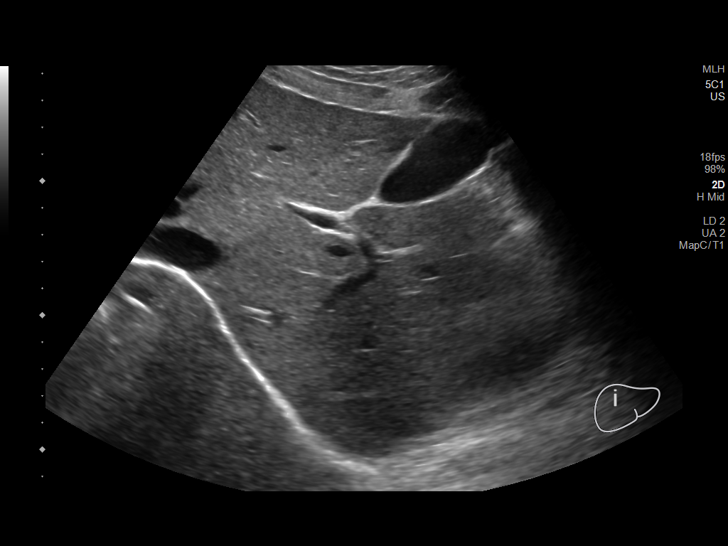
[im 46/62]
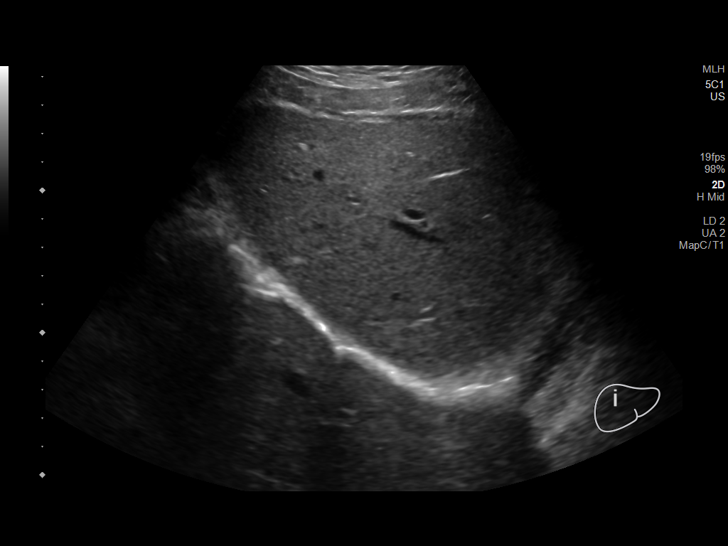
[im 51/62]
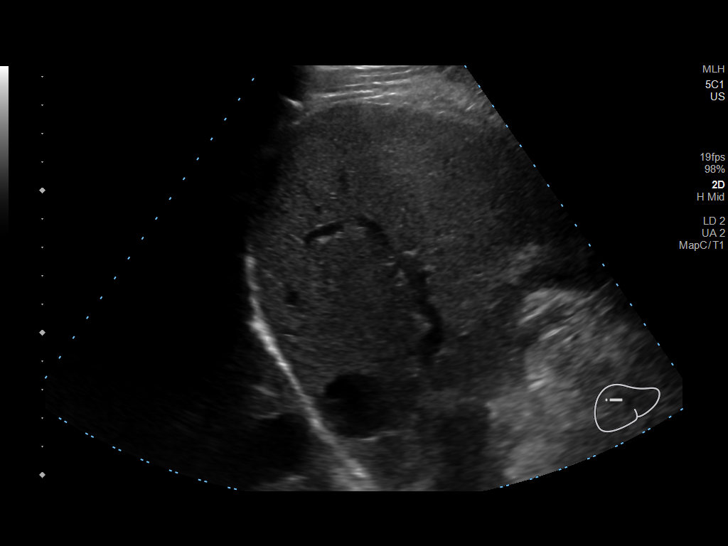
[im 56/62]
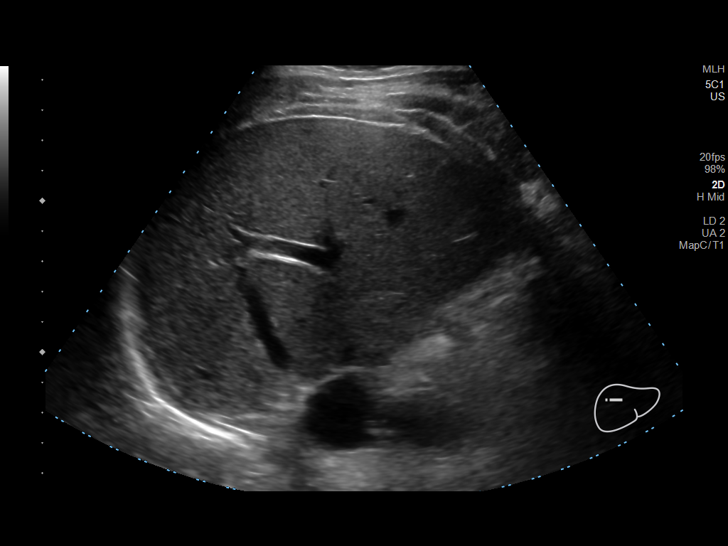
[im 62/62]
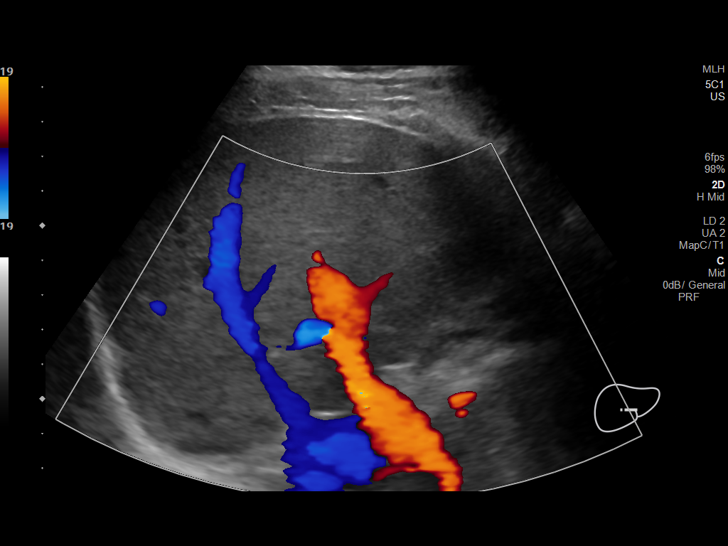

[14 of 25 positions shown; findings below may reference images not displayed]

FINDINGS: Gallbladder:

No gallstones or wall thickening visualized. No sonographic Murphy
sign noted by sonographer.

Common bile duct:

Diameter: 2.4 mm

Liver:

No focal lesion identified. Within normal limits in parenchymal
echogenicity. Portal vein is patent on color Doppler imaging with
normal direction of blood flow towards the liver.

Other: None.
IMPRESSION: Normal right upper quadrant ultrasound. No acute abnormality
identified.

## 2022-02-16 ENCOUNTER — Encounter: Payer: Self-pay | Admitting: Medical

## 2022-02-16 ENCOUNTER — Ambulatory Visit (INDEPENDENT_AMBULATORY_CARE_PROVIDER_SITE_OTHER): Payer: 59 | Admitting: Medical

## 2022-02-16 VITALS — BP 110/60 | HR 66 | Ht 69.0 in | Wt 157.8 lb

## 2022-02-16 DIAGNOSIS — Z299 Encounter for prophylactic measures, unspecified: Secondary | ICD-10-CM

## 2022-02-16 DIAGNOSIS — R869 Unspecified abnormal finding in specimens from male genital organs: Secondary | ICD-10-CM

## 2022-02-16 DIAGNOSIS — Z79899 Other long term (current) drug therapy: Secondary | ICD-10-CM

## 2022-02-16 DIAGNOSIS — Z7252 High risk homosexual behavior: Secondary | ICD-10-CM

## 2022-02-16 DIAGNOSIS — Z13220 Encounter for screening for lipoid disorders: Secondary | ICD-10-CM

## 2022-02-16 DIAGNOSIS — Z0000 Encounter for general adult medical examination without abnormal findings: Secondary | ICD-10-CM

## 2022-02-16 DIAGNOSIS — Z113 Encounter for screening for infections with a predominantly sexual mode of transmission: Secondary | ICD-10-CM

## 2022-02-16 LAB — POCT URINALYSIS DIP (PROADVANTAGE DEVICE)
Bilirubin, UA: NEGATIVE
Blood, UA: NEGATIVE
Glucose, UA: NEGATIVE mg/dL
Ketones, POC UA: NEGATIVE mg/dL
Leukocytes, UA: NEGATIVE
Nitrite, UA: NEGATIVE
Protein Ur, POC: NEGATIVE mg/dL
Specific Gravity, Urine: 1.01
Urobilinogen, Ur: NEGATIVE
pH, UA: 7 (ref 5.0–8.0)

## 2022-02-16 MED ORDER — EMTRICITABINE-TENOFOVIR DF 200-300 MG PO TABS: 1.0000 | ORAL_TABLET | Freq: Every day | ORAL | 0 refills | Status: DC

## 2022-02-16 MED ORDER — SULFAMETHOXAZOLE-TRIMETHOPRIM 800-160 MG PO TABS
1.0000 | ORAL_TABLET | Freq: Two times a day (BID) | ORAL | 0 refills | Status: DC
Start: 2022-02-16 — End: 2022-02-26

## 2022-02-16 NOTE — Progress Notes (Signed)
Subjective:   HPI  Wesley Joseph is a 31 y.o. male who presents for Chief Complaint  Patient presents with   fasting cpe    Fasting cpe, semen is bloody, urine looks normal, started a couple days ago    Patient Care Team: Unita Detamore, Leward Quan as PCP - General (Family Medicine) Sees dentist Sees eye doctor Dr. Jeffie Pollock, [facility]  Concerns: Doing fine except started having brownish coloration in semen in past week.  Has 1 new partner since last screening for STD.  He is not sure he wants to remain on Truvada. Worried about long term risks and only 1 partner currently.  No burning with urination, no polyuria, no testicle pain or swelling, no palpated mass of penis or testicles.  Otherwise feels fine.  Reviewed their medical, surgical, family, social, medication, and allergy history and updated chart as appropriate.  Past Medical History:  Diagnosis Date   Eczema    History of condyloma acuminatum    Urethral lesion 09/2020   and prostatic lesion   Wears glasses     Past Surgical History:  Procedure Laterality Date   CYSTOSCOPY WITH BIOPSY N/A 10/07/2020   Procedure: CYSTOSCOPY WITH BIOPSY AND FULGURATION;  Surgeon: Irine Seal, MD;  Location: [facility];  Service: Urology;  Laterality: N/A;   HERNIA REPAIR  1992 (infant)   per pt possible right inguinal    WART FULGURATION N/A 02/07/2015   Procedure: FULGURATION ANAL CONDYLOMA;  Surgeon: Aviva Signs, MD;  Location: [facility];  Service: General;  Laterality: N/A;    Family History  Problem Relation Age of Onset   Hypertension Mother    Alzheimer's disease Maternal Grandmother    Diabetes Maternal Grandfather    Diabetes Paternal Grandfather    Heart disease Neg Hx    Cancer Neg Hx    Stroke Neg Hx      Current Outpatient Medications:    Multiple Vitamins-Minerals (MULTIVITAMIN MEN PO), Take by mouth daily., Disp: , Rfl:    sulfamethoxazole-trimethoprim (BACTRIM DS) 800-160 MG tablet, Take 1  tablet by mouth 2 (two) times daily., Disp: 14 tablet, Rfl: 0   triamcinolone cream (KENALOG) 0.1 %, Apply 1 application topically 2 (two) times daily as needed (ACNE)., Disp: , Rfl:    emtricitabine-tenofovir (TRUVADA) 200-300 MG tablet, Take 1 tablet by mouth daily., Disp: 90 tablet, Rfl: 0  No Known Allergies   Review of Systems Constitutional: -fever, -chills, -sweats, -unexpected weight change, -decreased appetite, -fatigue Allergy: -sneezing, -itching, -congestion Dermatology: -changing moles, --rash, -lumps ENT: -runny nose, -ear pain, -sore throat, -hoarseness, -sinus pain, -teeth pain, - ringing in ears, -hearing loss, -nosebleeds Cardiology: -chest pain, -palpitations, -swelling, -difficulty breathing when lying flat, -waking up short of breath Respiratory: -cough, -shortness of breath, -difficulty breathing with exercise or exertion, -wheezing, -coughing up blood Gastroenterology: -abdominal pain, -nausea, -vomiting, -diarrhea, -constipation, -blood in stool, -changes in bowel movement, -difficulty swallowing or eating Hematology: -bleeding, -bruising  Musculoskeletal: -joint aches, -muscle aches, -joint swelling, -back pain, -neck pain, -cramping, -changes in gait Ophthalmology: denies vision changes, eye redness, itching, discharge Urology: -burning with urination, -difficulty urinating, -blood in urine, -urinary frequency, -urgency, -incontinence Neurology: -headache, -weakness, -tingling, -numbness, -memory loss, -falls, -dizziness Psychology: -depressed mood, -agitation, -sleep problems Male GU: no testicular mass, pain, no lymph nodes swollen, no swelling, no rash.     02/16/2022   10:35 AM 02/14/2021    2:03 PM 01/26/2020   10:12 AM 12/17/2016    3:00 PM  Depression  screen PHQ 2/9  Decreased Interest 0 0 0 0  Down, Depressed, Hopeless 0 0 0 0  PHQ - 2 Score 0 0 0 0        Objective:  BP 110/60   Pulse 66   Ht $R'5\' 9"'nr$  (1.753 m)   Wt 157 lb 12.8 oz (71.6 kg)   BMI  23.30 kg/m   General appearance: alert, no distress, WD/WN, Caucasian male Skin: unremarkable HEENT: normocephalic, conjunctiva/corneas normal, sclerae anicteric, PERRLA, EOMi, nares patent, no discharge or erythema, pharynx normal Oral cavity: MMM, tongue normal, teeth normal Neck: supple, no lymphadenopathy, no thyromegaly, no masses, normal ROM, no bruits Chest: non tender, normal shape and expansion Heart: RRR, normal S1, S2, no murmurs Lungs: CTA bilaterally, no wheezes, rhonchi, or rales Abdomen: +bs, soft, non tender, non distended, no masses, no hepatomegaly, no splenomegaly, no bruits Back: non tender, normal ROM, no scoliosis Musculoskeletal: upper extremities non tender, no obvious deformity, normal ROM throughout, lower extremities non tender, no obvious deformity, normal ROM throughout Extremities: no edema, no cyanosis, no clubbing Pulses: 2+ symmetric, upper and lower extremities, normal cap refill Neurological: alert, oriented x 3, CN2-12 intact, strength normal upper extremities and lower extremities, sensation normal throughout, DTRs 2+ throughout, no cerebellar signs, gait normal Psychiatric: normal affect, behavior normal, pleasant  GU: normal male external genitalia,circumcised, nontender, no masses, no hernia, no lymphadenopathy Rectal: deferred   Assessment and Plan :   Encounter Diagnoses  Name Primary?   Encounter for health maintenance examination in adult Yes   High risk medication use    High risk homosexual behavior    Prophylactic measure    Semen abnormal    Screen for STD (sexually transmitted disease)    Screening for lipid disorders     This visit was a preventative care visit, also known as wellness visit or routine physical.   Topics typically include healthy lifestyle, diet, exercise, preventative care, vaccinations, sick and well care, proper use of emergency dept and after hours care, as well as other concerns.      Recommendations: Continue to return yearly for your annual wellness and preventative care visits.  This gives [place] a chance to discuss healthy lifestyle, exercise, vaccinations, review your chart record, and perform screenings where appropriate.  I recommend you see your eye doctor yearly for routine vision care.  I recommend you see your dentist yearly for routine dental care including hygiene visits twice yearly.   Vaccination recommendations were reviewed Immunization History  Administered Date(s) Administered   DTaP 02/02/1993, 10/01/1994, 02/25/1996   HPV 9-valent 12/17/2016, 03/20/2017, 07/24/2017   Hepatitis B 09/30/1996, 11/16/1996, 10/05/2003   IPV 02/02/1993, 02/25/1996, 09/30/1996   Influenza,inj,Quad PF,6+ Mos 03/20/2017, 04/14/2018, 04/27/2020   MMR 10/10/1992, 09/30/1996   PFIZER(Purple Top)SARS-COV-2 Vaccination 09/25/2019, 10/16/2019   PPD Test 09/17/2009   Td 04/14/2016   Tdap 09/15/2009     Screening for cancer: Colon cancer screening: Age 88  Testicular cancer screening You should do a monthly self testicular exam if you are between 65-70 years old  We discussed PSA, prostate exam, and prostate cancer screening risks/benefits.     Skin cancer screening: Check your skin regularly for new changes, growing lesions, or other lesions of concern Come in for evaluation if you have skin lesions of concern.  Lung cancer screening: If you have a greater than 20 pack year history of tobacco use, then you may qualify for lung cancer screening with a chest CT scan.   Please call your insurance  company to inquire about coverage for this test.  We currently don't have screenings for other cancers besides breast, cervical, colon, and lung cancers.  If you have a strong family history of cancer or have other cancer screening concerns, please let me know.    Bone health: Get at least 150 minutes of aerobic exercise weekly Get weight bearing exercise at least once  weekly Bone density test:  A bone density test is an imaging test that uses a type of X-ray to measure the amount of calcium and other minerals in your bones. The test may be used to diagnose or screen you for a condition that causes weak or thin bones (osteoporosis), predict your risk for a broken bone (fracture), or determine how well your osteoporosis treatment is working. The bone density test is recommended for females 5 and older, or females or males <27 if certain risk factors such as thyroid disease, long term use of steroids such as for asthma or rheumatological issues, vitamin D deficiency, estrogen deficiency, family history of osteoporosis, self or family history of fragility fracture in first degree relative.    Heart health: Get at least 150 minutes of aerobic exercise weekly Limit alcohol It is important to maintain a healthy blood pressure and healthy cholesterol numbers  Heart disease screening: Screening for heart disease includes screening for blood pressure, fasting lipids, glucose/diabetes screening, BMI height to weight ratio, reviewed of smoking status, physical activity, and diet.    Goals include blood pressure 120/80 or less, maintaining a healthy lipid/cholesterol profile, preventing diabetes or keeping diabetes numbers under good control, not smoking or using tobacco products, exercising most days per week or at least 150 minutes per week of exercise, and eating healthy variety of fruits and vegetables, healthy oils, and avoiding unhealthy food choices like fried food, fast food, high sugar and high cholesterol foods.    Other tests may possibly include EKG test, CT coronary calcium score, echocardiogram, exercise treadmill stress test.    Medical care options: I recommend you continue to seek care here first for routine care.  We try really hard to have available appointments Monday through Friday daytime hours for sick visits, acute visits, and physicals.  Urgent  care should be used for after hours and weekends for significant issues that cannot wait till the next day.  The emergency department should be used for significant potentially life-threatening emergencies.  The emergency department is expensive, can often have long wait times for less significant concerns, so try to utilize primary care, urgent care, or telemedicine when possible to avoid unnecessary trips to the emergency department.  Virtual visits and telemedicine have been introduced since the pandemic started in 2020, and can be convenient ways to receive medical care.  We offer virtual appointments as well to assist you in a variety of options to seek medical care.   Advanced Directives: I recommend you consider completing a [place] and Living Will.   These documents respect your wishes and help alleviate burdens on your loved ones if you were to become terminally ill or be in a position to need those documents enforced.    You can complete Advanced Directives yourself, have them notarized, then have copies made for our office, for you and for anybody you feel should have them in safe keeping.  Or, you can have an attorney prepare these documents.   If you haven't updated your Last Will and Testament in a while, it may be worthwhile having  an attorney prepare these documents together and save on some costs.       Separate significant issues discussed: We discussed PrEP medication, risk and benefits, discussed his current sexual activity.  Counseled on condom use.  Labs today.  He will likely remain on PrEP.  I did send a refill today.  He has had prior treatment in the past year or so with urology for what was found to be warts on the inside of the urethra that had to be fulgurated.  Symptoms began with discolored semen then as well.  Urinalysis normal today.  Begin Bactrim empirically in case there is a prostate infection brewing.  Follow-up pending labs   Hazaiah was  seen today for fasting cpe.  Diagnoses and all orders for this visit:  Encounter for health maintenance examination in adult -     Comprehensive metabolic panel -     CBC -     Lipid panel -     POCT Urinalysis DIP (Proadvantage Device) -     RPR+HIV+GC+CT Panel -     Hepatitis C antibody -     Hepatitis B surface antigen  High risk medication use  High risk homosexual behavior  Prophylactic measure  Semen abnormal  Screen for STD (sexually transmitted disease) -     RPR+HIV+GC+CT Panel -     Hepatitis C antibody -     Hepatitis B surface antigen  Screening for lipid disorders -     Lipid panel  Other orders -     emtricitabine-tenofovir (TRUVADA) 200-300 MG tablet; Take 1 tablet by mouth daily. -     sulfamethoxazole-trimethoprim (BACTRIM DS) 800-160 MG tablet; Take 1 tablet by mouth 2 (two) times daily.    Follow-up pending labs, yearly for physical

## 2022-02-19 LAB — COMPREHENSIVE METABOLIC PANEL
ALT: 28 IU/L (ref 0–44)
AST: 30 IU/L (ref 0–40)
Albumin/Globulin Ratio: 2.4 — ABNORMAL HIGH (ref 1.2–2.2)
Albumin: 5.2 g/dL (ref 4.3–5.2)
Alkaline Phosphatase: 114 IU/L (ref 44–121)
BUN/Creatinine Ratio: 7 — ABNORMAL LOW (ref 9–20)
BUN: 7 mg/dL (ref 6–20)
Bilirubin Total: 1.1 mg/dL (ref 0.0–1.2)
CO2: 24 mmol/L (ref 20–29)
Calcium: 10 mg/dL (ref 8.7–10.2)
Chloride: 100 mmol/L (ref 96–106)
Creatinine, Ser: 1.02 mg/dL (ref 0.76–1.27)
Globulin, Total: 2.2 g/dL (ref 1.5–4.5)
Glucose: 91 mg/dL (ref 70–99)
Potassium: 4.7 mmol/L (ref 3.5–5.2)
Sodium: 139 mmol/L (ref 134–144)
Total Protein: 7.4 g/dL (ref 6.0–8.5)
eGFR: 101 mL/min/{1.73_m2} (ref >59)

## 2022-02-19 LAB — CBC
Hematocrit: 45.9 % (ref 37.5–51.0)
Hemoglobin: 16.5 g/dL (ref 13.0–17.7)
MCH: 32.5 pg (ref 26.6–33.0)
MCHC: 35.9 g/dL — ABNORMAL HIGH (ref 31.5–35.7)
MCV: 91 fL (ref 79–97)
Platelets: 206 10*3/uL (ref 150–450)
RBC: 5.07 x10E6/uL (ref 4.14–5.80)
RDW: 12.2 % (ref 11.6–15.4)
WBC: 4.2 10*3/uL (ref 3.4–10.8)

## 2022-02-19 LAB — RPR+HIV+GC+CT PANEL
Chlamydia trachomatis, NAA: NEGATIVE
HIV Screen 4th Generation wRfx: NONREACTIVE
Neisseria Gonorrhoeae by PCR: NEGATIVE
RPR Ser Ql: NONREACTIVE

## 2022-02-19 LAB — LIPID PANEL
Chol/HDL Ratio: 3.9 ratio (ref 0.0–5.0)
Cholesterol, Total: 168 mg/dL (ref 100–199)
HDL: 43 mg/dL (ref >39)
LDL Chol Calc (NIH): 111 mg/dL — ABNORMAL HIGH (ref 0–99)
Triglycerides: 71 mg/dL (ref 0–149)
VLDL Cholesterol Cal: 14 mg/dL (ref 5–40)

## 2022-02-19 LAB — HEPATITIS B SURFACE ANTIGEN: Hepatitis B Surface Ag: NEGATIVE

## 2022-02-19 LAB — HEPATITIS C ANTIBODY: Hep C Virus Ab: NONREACTIVE

## 2022-02-26 ENCOUNTER — Other Ambulatory Visit: Payer: Self-pay | Admitting: Medical

## 2022-02-26 MED ORDER — DOXYCYCLINE HYCLATE 100 MG PO TABS: 100.0000 mg | ORAL_TABLET | Freq: Two times a day (BID) | ORAL | 0 refills | Status: DC

## 2022-03-21 ENCOUNTER — Encounter: Payer: Self-pay | Admitting: Internal Medicine

## 2022-05-24 ENCOUNTER — Other Ambulatory Visit: Payer: Self-pay | Admitting: Medical

## 2022-05-24 ENCOUNTER — Telehealth: Payer: Self-pay | Admitting: Medical

## 2022-05-24 DIAGNOSIS — Z7252 High risk homosexual behavior: Secondary | ICD-10-CM

## 2022-05-24 DIAGNOSIS — Z113 Encounter for screening for infections with a predominantly sexual mode of transmission: Secondary | ICD-10-CM

## 2022-05-24 DIAGNOSIS — Z299 Encounter for prophylactic measures, unspecified: Secondary | ICD-10-CM

## 2022-05-24 NOTE — Telephone Encounter (Signed)
What labs will this pt be coming in for tomorrow?

## 2022-05-25 ENCOUNTER — Other Ambulatory Visit: Payer: 59

## 2022-05-25 DIAGNOSIS — Z7252 High risk homosexual behavior: Secondary | ICD-10-CM

## 2022-05-25 DIAGNOSIS — Z299 Encounter for prophylactic measures, unspecified: Secondary | ICD-10-CM

## 2022-05-25 DIAGNOSIS — Z113 Encounter for screening for infections with a predominantly sexual mode of transmission: Secondary | ICD-10-CM

## 2022-05-26 LAB — HIV ANTIBODY (ROUTINE TESTING W REFLEX): HIV Screen 4th Generation wRfx: NONREACTIVE

## 2022-05-29 ENCOUNTER — Ambulatory Visit: Payer: 59 | Admitting: Medical

## 2022-05-29 VITALS — BP 122/80 | HR 64 | Temp 97.6°F

## 2022-05-29 DIAGNOSIS — W540XXA Bitten by dog, initial encounter: Secondary | ICD-10-CM

## 2022-05-29 DIAGNOSIS — S41152A Open bite of left upper arm, initial encounter: Secondary | ICD-10-CM

## 2022-05-29 DIAGNOSIS — Z23 Encounter for immunization: Secondary | ICD-10-CM

## 2022-05-29 DIAGNOSIS — S51832A Puncture wound without foreign body of left forearm, initial encounter: Secondary | ICD-10-CM

## 2022-05-29 MED ORDER — AMOXICILLIN-POT CLAVULANATE 875-125 MG PO TABS: 1.0000 | ORAL_TABLET | Freq: Two times a day (BID) | ORAL | 0 refills | Status: DC

## 2022-05-29 NOTE — Progress Notes (Signed)
Subjective:  Wesley Joseph is a 31 y.o. male who presents for Chief Complaint  Patient presents with   Animal Bite    Dog bite Friday night. Bruised, sore and puncture wound, last tetanus that he knows was 2011     Here for dog bite injury. DOI 05/25/22.   He was walking his dog when another dog came up behind them and attacked.  He got bit trying  to get his dog away.   He was bit on the left forearm.  Bite was a bite and release, and he did get a puncture wound to left forearm but the dog didn't hold own, and he notes the pain as fairly minimum at this point.   The area is bruised and swollen, but no fever, no warmth, no pus or drainage.  Has some localized pain but no numbness, tingling or weakness.     The dog ran off back inside a home.  When he tried to talked with the homeowner no one would talk with him.    He wanted the  wound checked out and was curious whether he needed rabies vaccine.  No other aggravating or relieving factors.    No other c/o.  Past Medical History:  Diagnosis Date   Eczema    History of condyloma acuminatum    Urethral lesion 09/2020   and prostatic lesion   Wears glasses    Current Outpatient Medications on File Prior to Visit  Medication Sig Dispense Refill   emtricitabine-tenofovir (TRUVADA) 200-300 MG tablet Take 1 tablet by mouth daily. 90 tablet 0   Multiple Vitamins-Minerals (MULTIVITAMIN MEN PO) Take by mouth daily.     triamcinolone cream (KENALOG) 0.1 % Apply 1 application topically 2 (two) times daily as needed (ACNE).     No current facility-administered medications on file prior to visit.     The following portions of the patient's history were reviewed and updated as appropriate: allergies, current medications, past family history, past medical history, past social history, past surgical history and problem list.  ROS Otherwise as in subjective above  Objective: BP 122/80   Pulse 64   Temp 97.6 F (36.4 C)   General  appearance: alert, no distress, well developed, well nourished Left forearm with localized swelling midshaft, there is what appears to be a puncture wound without open skin on the posterior left forearm medially but no localized erythema induration or fluctuance or warmth.  There is some yellowish bruising of the forearm.  Mildly tender at the site of the bite.  Only 1 open wound that is trying to heal appropriately. Arm is neurovascularly intact, good strength and sensation    Assessment: Encounter Diagnoses  Name Primary?   Dog bite of left upper extremity, initial encounter Yes   Puncture wound of left forearm, initial encounter    Need for Tdap vaccination      Plan: We discussed his symptoms and findings.  A bite report was submitted to [facility] animal services and faxed over while he was here.  I actually called and spoke to them as well and they are having an officer investigate the case.  I have we will call out Augmentin antibiotic today to cover for infection even though there is no obvious sign of significant infection currently.  We updated tetanus as well today.  I advised if he was concerned about rabies postexposure prophylaxis to go to the emergency department.  He will expect a phone call from animal control  Counseled on the Tdap (tetanus, diptheria, and acellular pertussis) vaccine.  Vaccine information sheet given. Tdap vaccine given after consent obtained.   Wesley Joseph was seen today for animal bite.  Diagnoses and all orders for this visit:  Dog bite of left upper extremity, initial encounter  Puncture wound of left forearm, initial encounter  Need for Tdap vaccination  Other orders -     amoxicillin-clavulanate (AUGMENTIN) 875-125 MG tablet; Take 1 tablet by mouth 2 (two) times daily.    Follow up: with animal control

## 2022-05-29 NOTE — Patient Instructions (Signed)
We updated your tetanus booster today.  Begin Augmentin antibiotic to cover for potential infection.  I sent this to your pharmacy.  Keep the wound clean with soap and water  Consider ice water therapy for pain and inflammation given that there is bruising and swelling.  You can use a bag of frozen peas or ice water pack with a cloth in between the ice and your skin.  Do this for 15 to 20 minutes twice daily.  For the next few days  We reached out to animal control.  They will be investigating this and be in contact with you.  If you are concern for rabies postexposure prophylactics or treatment then I recommend he go to the emergency department for that specific treatment.  This is not available as a treatment through your local health department or here.

## 2022-05-29 NOTE — Addendum Note (Signed)
Addended by: Herminio Commons A on: 05/29/2022 11:11 AM   Modules accepted: Orders

## 2022-06-05 ENCOUNTER — Emergency Department (HOSPITAL_COMMUNITY)
Admission: EM | Admit: 2022-06-05 | Discharge: 2022-06-05 | Disposition: A | Discharge status: Home / Self Care | Payer: 59 | Attending: Emergency Medicine | Admitting: Emergency Medicine

## 2022-06-05 ENCOUNTER — Encounter (HOSPITAL_COMMUNITY): Payer: Self-pay | Admitting: Emergency Medicine

## 2022-06-05 ENCOUNTER — Other Ambulatory Visit: Payer: Self-pay

## 2022-06-05 DIAGNOSIS — W540XXA Bitten by dog, initial encounter: Secondary | ICD-10-CM | POA: Insufficient documentation

## 2022-06-05 DIAGNOSIS — S59912A Unspecified injury of left forearm, initial encounter: Secondary | ICD-10-CM | POA: Diagnosis present

## 2022-06-05 DIAGNOSIS — Z23 Encounter for immunization: Secondary | ICD-10-CM | POA: Diagnosis not present

## 2022-06-05 DIAGNOSIS — Z2914 Encounter for prophylactic rabies immune globin: Secondary | ICD-10-CM | POA: Diagnosis not present

## 2022-06-05 DIAGNOSIS — Y93K1 Activity, walking an animal: Secondary | ICD-10-CM | POA: Diagnosis not present

## 2022-06-05 DIAGNOSIS — S51832A Puncture wound without foreign body of left forearm, initial encounter: Secondary | ICD-10-CM | POA: Insufficient documentation

## 2022-06-05 DIAGNOSIS — Z203 Contact with and (suspected) exposure to rabies: Secondary | ICD-10-CM | POA: Insufficient documentation

## 2022-06-05 MED ORDER — RABIES VACCINE, PCEC IM SUSR
1.0000 mL | Freq: Once | INTRAMUSCULAR | Status: AC
  Administered 2022-06-05: 1 mL via INTRAMUSCULAR
  Filled 2022-06-05: qty 1

## 2022-06-05 MED ORDER — RABIES IMMUNE GLOBULIN 150 UNIT/ML IM INJ
20.0000 [IU]/kg | INJECTION | Freq: Once | INTRAMUSCULAR | Status: AC
  Administered 2022-06-05: 1425 [IU] via INTRAMUSCULAR
  Filled 2022-06-05: qty 10

## 2022-06-05 NOTE — Discharge Instructions (Addendum)
Please follow-up according to the schedule on these discharge papers.

## 2022-06-05 NOTE — ED Provider Triage Note (Signed)
Emergency Medicine Provider Triage Evaluation Note  Wesley Joseph , a 31 y.o. male  was evaluated in triage.  Pt complains of dog bite from 11/10.  Patient has been seen by provider, he was given tetanus shot and antibiotics.  Returns today because he states that he does not know vaccination record of the dog.  There is a dog bite mark on the left forearm.  Denies fever, blood or fluid drainage from the bite. There is mild swelling around the wound.  Review of Systems  Positive: As above Negative: As above  Physical Exam  BP (!) 168/92 (BP Location: Right Arm)   Pulse 95   Temp 97.6 F (36.4 C) (Oral)   Resp 16   Ht 5\' 6"  (1.676 m)   Wt 70.3 kg   SpO2 100%   BMI 25.02 kg/m  Gen:   Awake, no distress   Resp:  Normal effort  MSK:   Moves extremities without difficulty  Other:  Dog bite mark on left forearm.  Medical Decision Making  Medically screening exam initiated at 4:41 PM.  Appropriate orders placed.  Wesley Joseph was informed that the remainder of the evaluation will be completed by another provider, this initial triage assessment does not replace that evaluation, and the importance of remaining in the ED until their evaluation is complete.     Wesley Joseph, Wesley Joseph 06/05/22 2102

## 2022-06-05 NOTE — ED Provider Notes (Signed)
[facility] EMERGENCY DEPARTMENT Provider Note   CSN: [id number] Arrival date & time: 06/05/22  1611     History  Chief Complaint  Patient presents with   Animal Bite    Wesley Joseph is a 31 y.o. male presenting after sustaining a dog bite over a week ago.  Reports that he was walking his dog when another dog came and bit him on his left arm.  He contacted animal control who tried to go and capture the dog however the dog's owners did not cooperate and said they did not have a dog.  He previously was seen and got a tetanus shot and Augmentin but now needs rabies vaccines.   Animal Bite      Home Medications Prior to Admission medications   Medication Sig Start Date End Date Taking? Authorizing Provider  amoxicillin-clavulanate (AUGMENTIN) 875-125 MG tablet Take 1 tablet by mouth 2 (two) times daily. 05/29/22   Tysinger, Kermit Balo, PA-C  emtricitabine-tenofovir (TRUVADA) 200-300 MG tablet Take 1 tablet by mouth daily. 02/16/22   Tysinger, Kermit Balo, PA-C  Multiple Vitamins-Minerals (MULTIVITAMIN MEN PO) Take by mouth daily.    [provider]  triamcinolone cream (KENALOG) 0.1 % Apply 1 application topically 2 (two) times daily as needed (ACNE).    [provider]      Allergies    Patient has no known allergies.    Review of Systems   Review of Systems  Physical Exam Updated Vital Signs BP (!) 168/92 (BP Location: Right Arm)   Pulse 95   Temp 97.6 F (36.4 C) (Oral)   Resp 16   Ht 5\' 6"  (1.676 m)   Wt 70.3 kg   SpO2 100%   BMI 25.02 kg/m  Physical Exam Vitals and nursing note reviewed.  Constitutional:      Appearance: Normal appearance.  HENT:     Head: Normocephalic and atraumatic.  Eyes:     General: No scleral icterus.    Conjunctiva/sclera: Conjunctivae normal.  Pulmonary:     Effort: Pulmonary effort is normal. No respiratory distress.  Musculoskeletal:     Comments: Puncture wound to the left forearm with associated  superficial scratches.  Strong radial pulse  Skin:    Findings: No rash.  Neurological:     Mental Status: He is alert.  Psychiatric:        Mood and Affect: Mood normal.     ED Results / Procedures / Treatments   Labs (all labs ordered are listed, but only abnormal results are displayed) Labs Reviewed - No data to display  EKG None  Radiology No results found.  Procedures Procedures   Medications Ordered in ED Medications  rabies immune globulin (HYPERRAB/KEDRAB) injection 1,425 Units (has no administration in time range)  rabies vaccine (RABAVERT) injection 1 mL (has no administration in time range)    ED Course/ Medical Decision Making/ A&P                           Medical Decision Making Risk Prescription drug management.   31 year old male presenting today due to an animal bite.  Reports that an unknown dog bit him and no rabies vaccination has been able to be confirmed.  Already has tetanus and antibiotics, rabies vaccinations initiated today.  Return precautions discussed and patient will follow-up according to the schedule.  Final Clinical Impression(s) / ED Diagnoses Final diagnoses:  Dog bite, initial encounter  Rx / DC Orders ED Discharge Orders     None      Results and diagnoses were explained to the patient. Return precautions discussed in full. Patient had no additional questions and expressed complete understanding.   This chart was dictated using voice recognition software.  Despite best efforts to proofread,  errors can occur which can change the documentation meaning.    Saddie Benders, PA-C 06/05/22 1753    Sloan Leiter, DO 06/05/22 2046

## 2022-06-05 NOTE — ED Triage Notes (Signed)
Pt bite by dog on left forearm 11 days ago. Unable to find records for dog.

## 2022-06-06 ENCOUNTER — Telehealth: Payer: Self-pay | Admitting: Medical

## 2022-06-06 NOTE — Telephone Encounter (Signed)
Transition Care Management Follow-up Telephone Call Date of discharge and from where: 06/06/2022 [place] How have you been since you were released from the hospital? ok Any questions or concerns? Yes must receive rabies vaccines due to owners   Items Reviewed: Did the pt receive and understand the discharge instructions provided? Yes  Medications obtained and verified? Yes  Other? Yes [facility] rest of vaccines Any new allergies since your discharge? No  Dietary orders reviewed? Yes Do you have support at home? Yes   Home Care and Equipment/Supplies: Were home health services ordered? not applicable  PCP Hospital f/u appt confirmed? Yes  Scheduled to see [facility] on vaccines @ series. Pt must go [facility] to receive vaccine series pt is aware Are transportation arrangements needed? No  If their condition worsens, is the pt aware to call PCP or go to the Emergency Dept.? Yes Was the patient provided with contact information for the PCP's office or ED? Yes Was to pt encouraged to call back with questions or concerns? Yes

## 2022-06-08 ENCOUNTER — Ambulatory Visit (HOSPITAL_COMMUNITY)
Admission: RE | Admit: 2022-06-08 | Discharge: 2022-06-08 | Disposition: A | Discharge status: Home / Self Care | Payer: 59 | Source: Ambulatory Visit | Attending: Internal Medicine | Admitting: Internal Medicine

## 2022-06-08 DIAGNOSIS — Z203 Contact with and (suspected) exposure to rabies: Secondary | ICD-10-CM | POA: Diagnosis not present

## 2022-06-08 MED ORDER — RABIES VACCINE, PCEC IM SUSR
INTRAMUSCULAR | Status: AC
  Filled 2022-06-08: qty 1

## 2022-06-08 MED ORDER — RABIES VACCINE, PCEC IM SUSR
1.0000 mL | Freq: Once | INTRAMUSCULAR | Status: AC
  Administered 2022-06-08: 1 mL via INTRAMUSCULAR

## 2022-06-08 NOTE — ED Notes (Signed)
Pt came in today for second rabies vaccine. Injection was given in right deltoid and tolerated well. He has the dates of his next vaccines.

## 2022-06-11 ENCOUNTER — Other Ambulatory Visit: Payer: Self-pay | Admitting: Medical

## 2022-06-12 ENCOUNTER — Ambulatory Visit (HOSPITAL_COMMUNITY)
Admission: RE | Admit: 2022-06-12 | Discharge: 2022-06-12 | Disposition: A | Discharge status: Home / Self Care | Payer: 59 | Source: Ambulatory Visit | Attending: Internal Medicine | Admitting: Internal Medicine

## 2022-06-12 ENCOUNTER — Encounter (HOSPITAL_COMMUNITY): Payer: Self-pay

## 2022-06-12 DIAGNOSIS — Z203 Contact with and (suspected) exposure to rabies: Secondary | ICD-10-CM

## 2022-06-12 MED ORDER — RABIES VACCINE, PCEC IM SUSR
1.0000 mL | Freq: Once | INTRAMUSCULAR | Status: AC
Start: 2022-06-12 — End: 2022-06-12
  Administered 2022-06-12: 1 mL via INTRAMUSCULAR

## 2022-06-12 MED ORDER — RABIES VACCINE, PCEC IM SUSR
INTRAMUSCULAR | Status: AC
  Filled 2022-06-12: qty 1

## 2022-06-12 NOTE — ED Triage Notes (Signed)
Pt here to get 3rd rabies vaccination. Denies any complications from dog bite or other vaccinations.

## 2022-06-19 ENCOUNTER — Ambulatory Visit (HOSPITAL_COMMUNITY)
Admission: EM | Admit: 2022-06-19 | Discharge: 2022-06-19 | Disposition: A | Discharge status: Home / Self Care | Payer: 59 | Attending: Internal Medicine | Admitting: Internal Medicine

## 2022-06-19 DIAGNOSIS — Z203 Contact with and (suspected) exposure to rabies: Secondary | ICD-10-CM | POA: Diagnosis not present

## 2022-06-19 MED ORDER — RABIES VACCINE, PCEC IM SUSR
INTRAMUSCULAR | Status: AC
  Filled 2022-06-19: qty 1

## 2022-06-19 MED ORDER — RABIES VACCINE, PCEC IM SUSR
1.0000 mL | Freq: Once | INTRAMUSCULAR | Status: AC
  Administered 2022-06-19: 1 mL via INTRAMUSCULAR

## 2022-06-19 NOTE — ED Notes (Signed)
Pt came in today for his final rabies vaccine. He was given vaccine and tolerated well.

## 2022-09-05 ENCOUNTER — Other Ambulatory Visit: Payer: Self-pay | Admitting: Medical

## 2022-09-05 NOTE — Telephone Encounter (Signed)
For a visit

## 2022-09-05 NOTE — Telephone Encounter (Signed)
Pt is coming in tomorrow for labs

## 2022-09-06 ENCOUNTER — Ambulatory Visit: Payer: 59 | Admitting: Medical

## 2022-09-06 ENCOUNTER — Encounter: Payer: Self-pay | Admitting: Medical

## 2022-09-06 VITALS — BP 110/68 | HR 60 | Ht 69.0 in | Wt 158.8 lb

## 2022-09-06 DIAGNOSIS — Z299 Encounter for prophylactic measures, unspecified: Secondary | ICD-10-CM | POA: Diagnosis not present

## 2022-09-06 DIAGNOSIS — Z79899 Other long term (current) drug therapy: Secondary | ICD-10-CM | POA: Diagnosis not present

## 2022-09-06 DIAGNOSIS — Z113 Encounter for screening for infections with a predominantly sexual mode of transmission: Secondary | ICD-10-CM

## 2022-09-06 DIAGNOSIS — Z23 Encounter for immunization: Secondary | ICD-10-CM

## 2022-09-06 NOTE — Progress Notes (Signed)
   Established Patient Office Visit  Subjective:  Patient ID: Wesley Joseph, male    DOB: 1991-02-21  Age: 32 y.o. MRN: [id number]  CC:  Chief Complaint  Patient presents with   Follow-up    Follow up on Truvada.    HPI MOSI MONTREUIL presents for a follow up appointment; is tolerating Truvada well; denies any new issues.  Has had a new partner since last visit.  Using condoms.  No sypmtoms of concern.   Outpatient Medications Prior to Visit  Medication Sig Dispense Refill   emtricitabine-tenofovir (TRUVADA) 200-300 MG tablet TAKE 1 TABLET BY MOUTH DAILY 90 tablet 0   Multiple Vitamins-Minerals (MULTIVITAMIN MEN PO) Take by mouth daily.     triamcinolone cream (KENALOG) 0.1 % Apply 1 application topically 2 (two) times daily as needed (ACNE). (Patient not taking: Reported on 09/06/2022)     amoxicillin-clavulanate (AUGMENTIN) 875-125 MG tablet Take 1 tablet by mouth 2 (two) times daily. 20 tablet 0   No facility-administered medications prior to visit.    No Known Allergies  ROS as in subjective     Objective:    BP 110/68   Pulse 60   Ht 5' 9"$  (1.753 m)   Wt 158 lb 12.8 oz (72 kg)   SpO2 98%   BMI 23.45 kg/m   Gen: wd, wn, nad      Assessment & Plan:   Continue preventative measures, condom use, Truvada.  Discussed risk and benefits of medication.  Routine testing today.  Counseled on [employer] Covid virus vaccine.  Vaccine information sheet given.  Covid vaccine given after consent obtained.  Harly was seen today for follow-up.  Diagnoses and all orders for this visit:  Prophylactic measure -     HIV Antibody (routine testing w rflx) -     HIV Antibody (routine testing w rflx); Standing  Need for COVID-19 vaccine [employer] Fall 2023 Covid-19 Vaccine 88yr and older  High risk medication use -     HIV Antibody (routine testing w rflx); Standing  Screen for STD (sexually transmitted disease) -     HIV Antibody (routine testing w rflx) -      HIV Antibody (routine testing w rflx); Standing   F/u [facility]

## 2022-09-07 ENCOUNTER — Other Ambulatory Visit: Payer: Self-pay | Admitting: Medical

## 2022-09-07 LAB — HIV ANTIBODY (ROUTINE TESTING W REFLEX): HIV Screen 4th Generation wRfx: NONREACTIVE

## 2022-09-07 MED ORDER — EMTRICITABINE-TENOFOVIR DF 200-300 MG PO TABS
1.0000 | ORAL_TABLET | Freq: Every day | ORAL | 0 refills | Status: DC
Start: 2022-09-07 — End: 2022-12-03

## 2022-09-07 NOTE — Progress Notes (Signed)
Results sent through MyChart

## 2022-12-02 ENCOUNTER — Other Ambulatory Visit: Payer: Self-pay | Admitting: Medical

## 2022-12-05 ENCOUNTER — Other Ambulatory Visit: Payer: 59

## 2022-12-05 DIAGNOSIS — Z299 Encounter for prophylactic measures, unspecified: Secondary | ICD-10-CM

## 2022-12-05 DIAGNOSIS — Z113 Encounter for screening for infections with a predominantly sexual mode of transmission: Secondary | ICD-10-CM

## 2022-12-05 DIAGNOSIS — Z79899 Other long term (current) drug therapy: Secondary | ICD-10-CM

## 2022-12-06 ENCOUNTER — Other Ambulatory Visit: Payer: Self-pay | Admitting: Medical

## 2022-12-06 LAB — HIV ANTIBODY (ROUTINE TESTING W REFLEX): HIV Screen 4th Generation wRfx: NONREACTIVE

## 2022-12-06 NOTE — Progress Notes (Signed)
Results sent through MyChart

## 2023-01-22 ENCOUNTER — Encounter: Payer: Self-pay | Admitting: Medical

## 2023-01-22 ENCOUNTER — Ambulatory Visit: Payer: 59 | Admitting: Medical

## 2023-01-22 VITALS — BP 120/78 | HR 67 | Ht 69.0 in | Wt 151.0 lb

## 2023-01-22 DIAGNOSIS — G479 Sleep disorder, unspecified: Secondary | ICD-10-CM

## 2023-01-22 DIAGNOSIS — G43901 Migraine, unspecified, not intractable, with status migrainosus: Secondary | ICD-10-CM

## 2023-01-22 MED ORDER — NURTEC 75 MG PO TBDP: 1.0000 | ORAL_TABLET | Freq: Every day | ORAL | 2 refills | Status: AC | PRN

## 2023-01-22 NOTE — Progress Notes (Signed)
Subjective:  Wesley Joseph is a 32 y.o. male who presents for Chief Complaint  Patient presents with   Consult    Wakes up fine and then as the day progresses he experiences a sharp pain (a second) top, middle of head several times a day. Had a head injury in that area over 10 years ago. Also mentions he has been having more HA's/migraines lately. This has been since July 1st. Also says his ears have been itchy for a while.      Here for a week of unusual symptoms.  Started with random flashes of pain in top of head.  Feels fine most of the morning, but then in afternoon getting random pains in head.  Sometimes top or middle of head.  Sometimes gets general headache.  Been getting some migraines of late.  No pains in face though.  He notes hx/o remote head injury in top of head.  The pains are enough to break concentration or focus.  No associated visual changes, nausea or vomiting.  No neck pain, no upper back pain.  No arm symptoms.  When getting "migraines" has left sided pain, achy pain, movement and bright light can make it worse, but no nausea, no dizziness.   No numbness or tingling.  No headaches with exercise.   When he was younger he saw a pediatric neurologist and at the time cutting back on caffeine helped.  He notes hx/o migraines most of his life, but hadn't mentioned prior as he got accustomed to this.  Before last week would get migraine 1-2 per week.  No prior prevention medication.   For migraines will use combination of caffeine, acetaminophen, aspirin.  Headaches with migraines usually last > 1 day.  2 weeks ago he was doing more work in front of computer screen. Also, a new video game came out recently and he spent a lot of hours playing and not sleeping his normal sleep pattern.  Compliant with Truvada prep, no new sexual partners.  No other aggravating or relieving factors.    No other c/o.  Past Medical History:  Diagnosis Date   Eczema    History of condyloma  acuminatum    Urethral lesion 09/2020   and prostatic lesion   Wears glasses    Current Outpatient Medications on File Prior to Visit  Medication Sig Dispense Refill   emtricitabine-tenofovir (TRUVADA) 200-300 MG tablet TAKE 1 TABLET BY MOUTH DAILY 90 tablet 0   Multiple Vitamins-Minerals (MULTIVITAMIN MEN PO) Take by mouth daily.     triamcinolone cream (KENALOG) 0.1 % Apply 1 application topically 2 (two) times daily as needed (ACNE). (Patient not taking: Reported on 09/06/2022)     No current facility-administered medications on file prior to visit.     The following portions of the patient's history were reviewed and updated as appropriate: allergies, current medications, past family history, past medical history, past social history, past surgical history and problem list.  ROS Otherwise as in subjective above    Objective: BP 120/78   Pulse 67   Ht 5\' 9"  (1.753 m)   Wt 151 lb (68.5 kg)   SpO2 97%   BMI 22.30 kg/m   General appearance: alert, no distress, well developed, well nourished HEENT: normocephalic, sclerae anicteric, conjunctiva pink and moist, TMs pearly, nares patent, no discharge or erythema, pharynx normal Oral cavity: MMM, no lesions Neck: supple, no lymphadenopathy, no thyromegaly, no masses, no bruits Heart: RRR, normal S1, S2, no murmurs Lungs: CTA  bilaterally, no wheezes, rhonchi, or rales Pulses: 2+ radial pulses, 2+ pedal pulses, normal cap refill Ext: no edema Neuro: CN2-12 intact, nonfocal exam Psych: pleasant, answers questions appropriately    Assessment: Encounter Diagnoses  Name Primary?   Migraine with status migrainosus, not intractable, unspecified migraine type Yes   Sleep disturbance      Plan: We discussed his symptoms.  He has a history of migraines which have been fairly consistent 1-2 migraines per week but he has some new unusual symptoms in the last 2 weeks.  I suspect this is mainly due to change in sleep hygiene given he  has been playing a lot more hours of video games and spending more time in front of screens.  Nevertheless, begin trial of Nurtec for abortive therapy.  Gave 4 samples today.  Use Excedrin as a backup over-the-counter.  Discussed risk and benefits of medication.  Advised he work on less screen time, improve on sleep and sleep hygiene, drink at least 100oz of water intake throughout the day.  If no improvement over the next 2 weeks, consider CT head or MRI brain, consider some of the baseline labs.  Continue routine follow-up for Truvada prep   Norah was seen today for consult.  Diagnoses and all orders for this visit:  Migraine with status migrainosus, not intractable, unspecified migraine type  Sleep disturbance  Other orders -     Rimegepant Sulfate (NURTEC) 75 MG TBDP; Take 1 tablet (75 mg total) by mouth daily as needed.    Follow up: call report within 2 weeks

## 2023-01-29 ENCOUNTER — Telehealth: Payer: Self-pay | Admitting: Medical

## 2023-01-29 NOTE — Telephone Encounter (Signed)
P.A.NURTEC PA Case ID #: [id number] Rx #: [id number] Need Help? Call us at [phone] Status sent iconSent to Plan today Drug Nurtec 75MG  dispersible tablets ePA cloud logo Form [facility] Electronic Prior Authorization Form [phone] NCPDP)

## 2023-01-31 ENCOUNTER — Ambulatory Visit: Payer: 59 | Admitting: Medical

## 2023-01-31 VITALS — BP 120/78 | HR 71 | Temp 97.0°F | Wt 155.4 lb

## 2023-01-31 DIAGNOSIS — G500 Trigeminal neuralgia: Secondary | ICD-10-CM

## 2023-01-31 DIAGNOSIS — G43809 Other migraine, not intractable, without status migrainosus: Secondary | ICD-10-CM

## 2023-01-31 MED ORDER — CARBAMAZEPINE ER 100 MG PO CP12: 100.0000 mg | ORAL_CAPSULE | Freq: Two times a day (BID) | ORAL | 1 refills | Status: DC

## 2023-01-31 MED ORDER — UBRELVY 50 MG PO TABS: 1.0000 | ORAL_TABLET | Freq: Every day | ORAL | 0 refills | Status: DC | PRN

## 2023-01-31 NOTE — Progress Notes (Signed)
Subjective:  Wesley Joseph is a 32 y.o. male who presents for Chief Complaint  Patient presents with   Consult    Headaches, feels like they have improved, but had a headache yesterday last a second or two     Here for recheck on headaches.  I saw him last week for headaches.  He has a history of migraines but has also been having random jabs of pain in the Head.  He has a history of prior head injury years ago where he jumped up and Moved to the door jam and had to have staples.  This is the area of the top of his head that he feels the jabs of pain different than his other migraine type headaches.  Since last visit he tried Nurtec samples which helped about 60 to 70% for her migraine.  He tried this twice.  On 2 separate days  Overall headaches are improved but is still getting the jabs of pain in the top of his head but not as frequent as last week.  Last week he noted that he started with random flashes of pain in top of head.  Feels fine most of the morning, but then in afternoon getting random pains in head.  Sometimes top or middle of head.  Sometimes gets general headache.  Been getting some migraines of late.  No pains in face though.  He notes hx/o remote head injury in top of head.  The pains are enough to break concentration or focus.  No associated visual changes, nausea or vomiting.  No neck pain, no upper back pain.  No arm symptoms.  When getting "migraines" has left sided pain, achy pain, movement and bright light can make it worse, but no nausea, no dizziness.   No numbness or tingling.  No headaches with exercise.   When he was younger he saw a pediatric neurologist and at the time cutting back on caffeine helped.  He notes hx/o migraines most of his life, but hadn't mentioned prior as he got accustomed to this.  Before last week would get migraine 1-2 per week.  No prior prevention medication.   For migraines will use combination of caffeine, acetaminophen, aspirin.  Headaches  with migraines usually last > 1 day.  3 weeks ago he was doing more work in front of computer screen. Also, a new video game came out recently and he spent a lot of hours playing and not sleeping his normal sleep pattern.  No other aggravating or relieving factors.    No other c/o.  Past Medical History:  Diagnosis Date   Eczema    History of condyloma acuminatum    Urethral lesion 09/2020   and prostatic lesion   Wears glasses    Current Outpatient Medications on File Prior to Visit  Medication Sig Dispense Refill   emtricitabine-tenofovir (TRUVADA) 200-300 MG tablet TAKE 1 TABLET BY MOUTH DAILY 90 tablet 0   Multiple Vitamins-Minerals (MULTIVITAMIN MEN PO) Take by mouth daily.     Rimegepant Sulfate (NURTEC) 75 MG TBDP Take 1 tablet (75 mg total) by mouth daily as needed. 8 tablet 2   triamcinolone cream (KENALOG) 0.1 % Apply 1 application  topically 2 (two) times daily as needed (ACNE).     No current facility-administered medications on file prior to visit.     The following portions of the patient's history were reviewed and updated as appropriate: allergies, current medications, past family history, past medical history, past social history, past  surgical history and problem list.  ROS Otherwise as in subjective above    Objective: BP 120/78   Pulse 71   Temp (!) 97 F (36.1 C)   Wt 155 lb 6.4 oz (70.5 kg)   BMI 22.95 kg/m   General appearance: alert, no distress, well developed, well nourished HEENT: normocephalic, sclerae anicteric, conjunctiva pink and moist, TMs pearly, nares patent, no discharge or erythema, pharynx normal Oral cavity: MMM, no lesions Neck: supple, no lymphadenopathy, no thyromegaly, no masses, no bruits Pulses: 2+ radial pulses, 2+ pedal pulses, normal cap refill Ext: no edema Neuro: CN2-12 intact, nonfocal exam Psych: pleasant, answers questions appropriately    Assessment: Encounter Diagnoses  Name Primary?   Trigeminal neuralgia  pain Yes   Other migraine without status migrainosus, not intractable      Plan: Symptoms suggest 2 different headaches, hx/o migraines, some recent migraines, but also different head pains suggestive of trigeminal neuralgia.  Begin trial of Carbamazepine trial.   Will order MRI brain  Nurtec helped with migraine but not covered by insurance.   Trial of Ubrelvy to see if that is covered by insurance for migraine abortion.  Discussed migraine prevention, good sleep hygiene, avoid excessive screen time, eat health foods and get good hydration   Wesley Joseph was seen today for consult.  Diagnoses and all orders for this visit:  Trigeminal neuralgia pain -     MR Brain Wo Contrast; Future  Other migraine without status migrainosus, not intractable -     MR Brain Wo Contrast; Future  Other orders -     Ubrogepant (UBRELVY) 50 MG TABS; Take 1 tablet (50 mg total) by mouth daily as needed. -     carbamazepine (CARBATROL) 100 MG 12 hr capsule; Take 1 capsule (100 mg total) by mouth 2 (two) times daily.    Follow up: pending MRI, 75mo f/u

## 2023-02-04 NOTE — Telephone Encounter (Signed)
P.A. denied, pt needs trial & failure of 2 triptans AND treated with one of the following amitriptyline, beta-blocker, candesartan, Amovig, Ajovy, Emgality, Qulipta, Vyepti, Depakote, Botox, Topamax, or Effexor  Do you want to switch?

## 2023-02-06 ENCOUNTER — Ambulatory Visit
Admission: RE | Admit: 2023-02-06 | Discharge: 2023-02-06 | Disposition: A | Discharge status: Home / Self Care | Payer: 59 | Source: Ambulatory Visit | Attending: Medical | Admitting: Medical

## 2023-02-06 DIAGNOSIS — G43809 Other migraine, not intractable, without status migrainosus: Secondary | ICD-10-CM

## 2023-02-06 DIAGNOSIS — G500 Trigeminal neuralgia: Secondary | ICD-10-CM

## 2023-02-07 NOTE — Telephone Encounter (Signed)
Pt informed & he said he was able to get the Ubrelvy, hasn't had a migraine to let us know how it is working but he will let us know.

## 2023-02-08 ENCOUNTER — Other Ambulatory Visit: Payer: Self-pay | Admitting: Medical

## 2023-02-08 DIAGNOSIS — G4489 Other headache syndrome: Secondary | ICD-10-CM

## 2023-02-08 DIAGNOSIS — R9089 Other abnormal findings on diagnostic imaging of central nervous system: Secondary | ICD-10-CM

## 2023-02-08 NOTE — Progress Notes (Signed)
Results sent through MyChart

## 2023-02-19 ENCOUNTER — Ambulatory Visit: Payer: 59 | Admitting: Neurology

## 2023-02-19 ENCOUNTER — Encounter: Payer: Self-pay | Admitting: Neurology

## 2023-02-19 VITALS — BP 129/84 | HR 102 | Ht 66.0 in | Wt 147.5 lb

## 2023-02-19 DIAGNOSIS — G43709 Chronic migraine without aura, not intractable, without status migrainosus: Secondary | ICD-10-CM | POA: Insufficient documentation

## 2023-02-19 MED ORDER — SUMATRIPTAN SUCCINATE 50 MG PO TABS: ORAL_TABLET | ORAL | 6 refills | Status: DC

## 2023-02-19 NOTE — Progress Notes (Signed)
Chief Complaint  Patient presents with   New Patient (Initial Visit)    Rm15, alone  headaches, abnormal brain MRI: 7 in pat 30 days    ASSESSMENT AND PLAN  Wesley Joseph is a 32 y.o. male   Chronic migraine headache  MRI of the brain reviewed with patient, nonspecific 7 mm T2/FLAIR hyperintensity chronic abnormality at the right cerebellum, likely small vessel disease, no other significant abnormalities,  His described transient sharp pain at the scalp is most consistent with ice pricking headache,  Responding well to CGRP antagonist, but not covered by insurance, Imitrex 50 mg as needed   DIAGNOSTIC DATA (LABS, IMAGING, TESTING) - I reviewed patient records, labs, notes, testing and imaging myself where available.   MEDICAL HISTORY:  Wesley Joseph is a 32 year old male, seen in request by his primary care physician Dr. Crosby Oyster for evaluation of migraine headache, initial evaluation February 19, 2023  I reviewed and summarized the referring note.  Past medical history  He had long history of chronic migraine headaches, typical migraine lateralized severe retro-orbital area pounding headache with light, noise sensitivity, nauseous, lasting for few hours,  He was having frequent headaches, taking frequent over-the-counter medications, over the past few months, he developed sudden episode of severe transient ice pricking pain in his scalp area, for that reason he was referred to MRI of the brain,  Personally reviewed the film from February 06, 2023, nonspecific 7 mm T2/FLAIR chronic small ischemic lesion at the right cerebellar hemisphere  He was given CGRP antagonist sample, did works well for him  PHYSICAL EXAM: Vitals:   02/19/23 1051  Weight: 147 lb 8 oz (66.9 kg)  Height: 5\' 6"  (1.676 m)   120/80  PHYSICAL EXAMNIATION:  Gen: NAD, conversant, well nourised, well groomed                     Cardiovascular: Regular rate rhythm, no peripheral edema, warm,  nontender. Eyes: Conjunctivae clear without exudates or hemorrhage Neck: Supple, no carotid bruits. Pulmonary: Clear to auscultation bilaterally   NEUROLOGICAL EXAM:  MENTAL STATUS: Speech/cognition: Awake, alert, oriented to history taking and casual conversation CRANIAL NERVES: CN II: Visual fields are full to confrontation. Pupils are round equal and briskly reactive to light. CN III, IV, VI: extraocular movement are normal. No ptosis. CN V: Facial sensation is intact to light touch CN VII: Face is symmetric with normal eye closure  CN VIII: Hearing is normal to causal conversation. CN IX, X: Phonation is normal. CN XI: Head turning and shoulder shrug are intact  MOTOR: There is no pronator drift of out-stretched arms. Muscle bulk and tone are normal. Muscle strength is normal.  REFLEXES: Reflexes are 2+ and symmetric at the biceps, triceps, knees, and ankles. Plantar responses are flexor.  SENSORY: Intact to light touch, pinprick and vibratory sensation are intact in fingers and toes.  COORDINATION: There is no trunk or limb dysmetria noted.  GAIT/STANCE: Posture is normal. Gait is steady with normal steps, base, arm swing, and turning. Heel and toe walking are normal. Tandem gait is normal.  Romberg is absent.  REVIEW OF SYSTEMS:  Full 14 system review of systems performed and notable only for as above All other review of systems were negative.   ALLERGIES: No Known Allergies  HOME MEDICATIONS: Current Outpatient Medications  Medication Sig Dispense Refill   carbamazepine (CARBATROL) 100 MG 12 hr capsule Take 1 capsule (100 mg total) by mouth 2 (two) times daily.  60 capsule 1   emtricitabine-tenofovir (TRUVADA) 200-300 MG tablet TAKE 1 TABLET BY MOUTH DAILY 90 tablet 0   Multiple Vitamins-Minerals (MULTIVITAMIN MEN PO) Take by mouth daily.     Rimegepant Sulfate (NURTEC) 75 MG TBDP Take 1 tablet (75 mg total) by mouth daily as needed. 8 tablet 2   triamcinolone  cream (KENALOG) 0.1 % Apply 1 application  topically 2 (two) times daily as needed (ACNE).     Ubrogepant (UBRELVY) 50 MG TABS Take 1 tablet (50 mg total) by mouth daily as needed. 10 tablet 0   No current facility-administered medications for this visit.    PAST MEDICAL HISTORY: Past Medical History:  Diagnosis Date   Eczema    History of condyloma acuminatum    Urethral lesion 09/2020   and prostatic lesion   Wears glasses     PAST SURGICAL HISTORY: Past Surgical History:  Procedure Laterality Date   CYSTOSCOPY WITH BIOPSY N/A 10/07/2020   Procedure: CYSTOSCOPY WITH BIOPSY AND FULGURATION;  Surgeon: Bjorn Pippin, MD;  Location: [facility];  Service: Urology;  Laterality: N/A;   HERNIA REPAIR  1992 (infant)   per pt possible right inguinal    WART FULGURATION N/A 02/07/2015   Procedure: FULGURATION ANAL CONDYLOMA;  Surgeon: Franky Macho, MD;  Location: [facility];  Service: General;  Laterality: N/A;    FAMILY HISTORY: Family History  Problem Relation Age of Onset   Hypertension Mother    Alzheimer's disease Maternal Grandmother    Diabetes Maternal Grandfather    Diabetes Paternal Grandfather    Heart disease Neg Hx    Cancer Neg Hx    Stroke Neg Hx     SOCIAL HISTORY: Social History   Socioeconomic History   Marital status: Single    Spouse name: Not on file   Number of children: Not on file   Years of education: Not on file   Highest education level: Not on file  Occupational History   Not on file  Tobacco Use   Smoking status: Former    Types: Cigarettes   Smokeless tobacco: Never  Vaping Use   Vaping status: Never Used  Substance and Sexual Activity   Alcohol use: Yes    Comment: rarely   Drug use: Not Currently    Comment: 10-05-2020  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   Exercises - almost  nothing.  [address] [place], [employer], [place], Therapist, occupational.   02/2022   Social Determinants of Health   Financial Resource Strain: Not on file  Food Insecurity: Not on file  Transportation Needs: Not on file  Physical Activity: Not on file  Stress: Not on file  Social Connections: Not on file  Intimate Partner Violence: Not on file      Levert Feinstein, M.D. Ph.D.  [facility] [address], [address] [place], [place] [address] Ph: [phone] Fax: [phone]  CC:  Tysinger, Kermit Balo, PA-C [address] [place],  [place] [address]  Tysinger, Kermit Balo, PA-C

## 2023-02-20 ENCOUNTER — Telehealth (INDEPENDENT_AMBULATORY_CARE_PROVIDER_SITE_OTHER): Payer: 59 | Admitting: Family Medicine

## 2023-02-20 ENCOUNTER — Encounter: Payer: Self-pay | Admitting: Medical

## 2023-02-20 VITALS — Wt 147.0 lb

## 2023-02-20 DIAGNOSIS — U071 COVID-19: Secondary | ICD-10-CM

## 2023-02-20 NOTE — Progress Notes (Signed)
   Subjective:    Patient ID: Wesley Joseph, male    DOB: January 21, 1991, 32 y.o.   MRN: [id number]  HPI Documentation for virtual audio and video telecommunications through Caregility encounter: The patient was located at home. 2 patient identifiers used.  The provider was located in the office. The patient did consent to this visit and is aware of possible charges through their insurance for this visit. The other persons participating in this telemedicine service were none. Time spent on call was 5 minutes and in review of previous records >15 minutes total for counseling and coordination of care. This virtual service is not related to other E/M service within previous 7 days.  He states that on Sunday he developed some nausea and Monday chest and nasal congestion, chills, myalgias and headache.  He did test on Tuesday and was positive.  Today he is having similar symptoms.  Review of Systems     Objective:    Physical Exam Alert and in no distress with normal breathing pattern.       Assessment & Plan:  COVID-19 Discussed treatment of this with Tylenol as well as possibly 2 Aleve twice per day to help with the fever aches and pains.  Cough medicines as needed.  Discussed worsening of symptoms in regard to fever, increasing cough and shortness of breath as reasons to get more aggressive.  He thinks that he is feeling slightly better today.  Explained that once he starts to feel better he can then become more active as long as he wears a mask for another 5 days.  I will give him a note to return to work on Friday.

## 2023-02-22 ENCOUNTER — Encounter: Payer: 59 | Admitting: Medical

## 2023-02-25 ENCOUNTER — Encounter: Payer: Self-pay | Admitting: Medical

## 2023-02-25 ENCOUNTER — Ambulatory Visit: Payer: 59 | Admitting: Medical

## 2023-02-25 VITALS — BP 110/64 | HR 66 | Ht 68.5 in | Wt 147.6 lb

## 2023-02-25 DIAGNOSIS — Z8616 Personal history of COVID-19: Secondary | ICD-10-CM

## 2023-02-25 DIAGNOSIS — Z13220 Encounter for screening for lipoid disorders: Secondary | ICD-10-CM

## 2023-02-25 DIAGNOSIS — Z7252 High risk homosexual behavior: Secondary | ICD-10-CM | POA: Diagnosis not present

## 2023-02-25 DIAGNOSIS — Z299 Encounter for prophylactic measures, unspecified: Secondary | ICD-10-CM

## 2023-02-25 DIAGNOSIS — Z0000 Encounter for general adult medical examination without abnormal findings: Secondary | ICD-10-CM

## 2023-02-25 DIAGNOSIS — Z79899 Other long term (current) drug therapy: Secondary | ICD-10-CM | POA: Diagnosis not present

## 2023-02-25 DIAGNOSIS — Z113 Encounter for screening for infections with a predominantly sexual mode of transmission: Secondary | ICD-10-CM

## 2023-02-25 DIAGNOSIS — G43709 Chronic migraine without aura, not intractable, without status migrainosus: Secondary | ICD-10-CM

## 2023-02-25 NOTE — Progress Notes (Signed)
Subjective:   HPI  Wesley Joseph is a 32 y.o. male who presents for Chief Complaint  Patient presents with   fasting cpe    Fasting cpe, no concerns, had covid last week and still having some symptoms    Patient Care Team: Hector Venne, Cleda Mccreedy as PCP - General (Family Medicine) Sees dentist Sees eye doctor Dr. Annabell Howells, [facility]  Concerns: Here for physical wellness today fasting.  He had COVID last week and saw Dr. Susann Givens \ here for virtual visit last week.  He still has some residual congestion but overall doing okay.  No fever, no shortness of breath, no other major symptoms at this point.   Reviewed their medical, surgical, family, social, medication, and allergy history and updated chart as appropriate.  Past Medical History:  Diagnosis Date   Eczema    History of condyloma acuminatum    Urethral lesion 09/2020   and prostatic lesion   Wears glasses     Past Surgical History:  Procedure Laterality Date   CYSTOSCOPY WITH BIOPSY N/A 10/07/2020   Procedure: CYSTOSCOPY WITH BIOPSY AND FULGURATION;  Surgeon: Bjorn Pippin, MD;  Location: [facility];  Service: Urology;  Laterality: N/A;   HERNIA REPAIR  1992 (infant)   per pt possible right inguinal    WART FULGURATION N/A 02/07/2015   Procedure: FULGURATION ANAL CONDYLOMA;  Surgeon: Franky Macho, MD;  Location: [facility];  Service: General;  Laterality: N/A;    Family History  Problem Relation Age of Onset   Hypertension Mother    Alzheimer's disease Maternal Grandmother    Diabetes Maternal Grandfather    Diabetes Paternal Grandfather    Heart disease Neg Hx    Cancer Neg Hx    Stroke Neg Hx      Current Outpatient Medications:    carbamazepine (CARBATROL) 100 MG 12 hr capsule, Take 1 capsule (100 mg total) by mouth 2 (two) times daily., Disp: 60 capsule, Rfl: 1   emtricitabine-tenofovir (TRUVADA) 200-300 MG tablet, TAKE 1 TABLET BY MOUTH DAILY, Disp: 90 tablet, Rfl: 0   Multiple  Vitamins-Minerals (MULTIVITAMIN MEN PO), Take by mouth daily., Disp: , Rfl:    Rimegepant Sulfate (NURTEC) 75 MG TBDP, Take 1 tablet (75 mg total) by mouth daily as needed., Disp: 8 tablet, Rfl: 2   SUMAtriptan (IMITREX) 50 MG tablet, May repeat in 2 hours if headache persists or recurs., Disp: 10 tablet, Rfl: 6   triamcinolone cream (KENALOG) 0.1 %, Apply 1 application  topically 2 (two) times daily as needed (ACNE)., Disp: , Rfl:   No Known Allergies  Review of Systems  Constitutional:  Negative for chills, fever, malaise/fatigue and weight loss.  HENT:  Positive for congestion. Negative for ear pain, hearing loss, sore throat and tinnitus.   Eyes:  Negative for blurred vision, pain and redness.  Respiratory:  Positive for cough. Negative for hemoptysis and shortness of breath.   Cardiovascular:  Negative for chest pain, palpitations, orthopnea, claudication and leg swelling.  Gastrointestinal:  Negative for abdominal pain, blood in stool, constipation, diarrhea, nausea and vomiting.  Genitourinary:  Negative for dysuria, flank pain, frequency, hematuria and urgency.  Musculoskeletal:  Negative for falls, joint pain and myalgias.  Skin:  Negative for itching and rash.  Neurological:  Negative for dizziness, tingling, speech change, weakness and headaches.  Endo/Heme/Allergies:  Negative for polydipsia. Does not bruise/bleed easily.  Psychiatric/Behavioral:  Negative for depression and memory loss. The patient is not nervous/anxious and does not have  insomnia.         02/25/2023   10:24 AM 09/06/2022    8:42 AM 02/16/2022   10:35 AM 02/14/2021    2:03 PM 01/26/2020   10:12 AM  Depression screen PHQ 2/9  Decreased Interest 0 0 0 0 0  Down, Depressed, Hopeless 0 0 0 0 0  PHQ - 2 Score 0 0 0 0 0        Objective:  BP 110/64   Pulse 66   Ht 5' 8.5" (1.74 m)   Wt 147 lb 9.6 oz (67 kg)   SpO2 99%   BMI 22.12 kg/m   General appearance: alert, no distress, WD/WN, Caucasian  male Skin: unremarkable HEENT: normocephalic, conjunctiva/corneas normal, sclerae anicteric, PERRLA, EOMi, nares patent, no discharge or erythema, pharynx normal Neck: supple, no lymphadenopathy, no thyromegaly, no masses, normal ROM, no bruits Chest: non tender, normal shape and expansion Heart: RRR, normal S1, S2, no murmurs Lungs: CTA bilaterally, no wheezes, rhonchi, or rales Abdomen: +bs, soft, non tender, non distended, no masses, no hepatomegaly, no splenomegaly, no bruits Back: non tender, normal ROM, no scoliosis Musculoskeletal: upper extremities non tender, no obvious deformity, normal ROM throughout, lower extremities non tender, no obvious deformity, normal ROM throughout Extremities: no edema, no cyanosis, no clubbing Pulses: 2+ symmetric, upper and lower extremities, normal cap refill Neurological: alert, oriented x 3, CN2-12 intact, strength normal upper extremities and lower extremities, sensation normal throughout, DTRs 2+ throughout, no cerebellar signs, gait normal Psychiatric: normal affect, behavior normal, pleasant  GU: normal male external genitalia,circumcised, nontender, no masses, no hernia, no lymphadenopathy Rectal: deferred   Assessment and Plan :   Encounter Diagnoses  Name Primary?   Encounter for health maintenance examination in adult Yes   High risk medication use    Prophylactic measure    High risk homosexual behavior    Chronic migraine w/o aura, not intractable, w/o stat migr    Screening for lipid disorders    History of COVID-19    Screen for STD (sexually transmitted disease)     This visit was a preventative care visit, also known as wellness visit or routine physical.   Topics typically include healthy lifestyle, diet, exercise, preventative care, vaccinations, sick and well care, proper use of emergency dept and after hours care, as well as other concerns.     Recommendations: Continue to return yearly for your annual wellness and  preventative care visits.  This gives [place] a chance to discuss healthy lifestyle, exercise, vaccinations, review your chart record, and perform screenings where appropriate.  I recommend you see your eye doctor yearly for routine vision care.  I recommend you see your dentist yearly for routine dental care including hygiene visits twice yearly.   Vaccination recommendations were reviewed Immunization History  Administered Date(s) Administered   COVID-19, mRNA, vaccine(Comirnaty)12 years and older 09/06/2022   DTaP 02/02/1993, 10/01/1994, 02/25/1996   HPV 9-valent 12/17/2016, 03/20/2017, 07/24/2017   Hepatitis B 09/30/1996, 11/16/1996, 10/05/2003   IPV 02/02/1993, 02/25/1996, 09/30/1996   Influenza,inj,Quad PF,6+ Mos 03/20/2017, 04/14/2018, 04/27/2020   MMR 10/10/1992, 09/30/1996   PFIZER(Purple Top)SARS-COV-2 Vaccination 09/25/2019, 10/16/2019, 07/25/2020, 06/16/2021   PPD Test 09/17/2009   Rabies, IM 06/05/2022, 06/08/2022, 06/12/2022, 06/19/2022   Td 04/14/2016   Tdap 09/15/2009, 05/29/2022     Screening for cancer: Colon cancer screening: Age 60  Testicular cancer screening You should do a monthly self testicular exam if you are between 50-37 years old  We discussed PSA, prostate exam, and prostate  cancer screening risks/benefits.     Skin cancer screening: Check your skin regularly for new changes, growing lesions, or other lesions of concern Come in for evaluation if you have skin lesions of concern.  Lung cancer screening: If you have a greater than 20 pack year history of tobacco use, then you may qualify for lung cancer screening with a chest CT scan.   Please call your insurance company to inquire about coverage for this test.  We currently don't have screenings for other cancers besides breast, cervical, colon, and lung cancers.  If you have a strong family history of cancer or have other cancer screening concerns, please let me know.    Bone health: Get at least  150 minutes of aerobic exercise weekly Get weight bearing exercise at least once weekly Bone density test:  A bone density test is an imaging test that uses a type of X-ray to measure the amount of calcium and other minerals in your bones. The test may be used to diagnose or screen you for a condition that causes weak or thin bones (osteoporosis), predict your risk for a broken bone (fracture), or determine how well your osteoporosis treatment is working. The bone density test is recommended for females 65 and older, or females or males <65 if certain risk factors such as thyroid disease, long term use of steroids such as for asthma or rheumatological issues, vitamin D deficiency, estrogen deficiency, family history of osteoporosis, self or family history of fragility fracture in first degree relative.    Heart health: Get at least 150 minutes of aerobic exercise weekly Limit alcohol It is important to maintain a healthy blood pressure and healthy cholesterol numbers  Heart disease screening: Screening for heart disease includes screening for blood pressure, fasting lipids, glucose/diabetes screening, BMI height to weight ratio, reviewed of smoking status, physical activity, and diet.    Goals include blood pressure 120/80 or less, maintaining a healthy lipid/cholesterol profile, preventing diabetes or keeping diabetes numbers under good control, not smoking or using tobacco products, exercising most days per week or at least 150 minutes per week of exercise, and eating healthy variety of fruits and vegetables, healthy oils, and avoiding unhealthy food choices like fried food, fast food, high sugar and high cholesterol foods.    Other tests may possibly include EKG test, CT coronary calcium score, echocardiogram, exercise treadmill stress test.    Medical care options: I recommend you continue to seek care here first for routine care.  We try really hard to have available appointments Monday  through Friday daytime hours for sick visits, acute visits, and physicals.  Urgent care should be used for after hours and weekends for significant issues that cannot wait till the next day.  The emergency department should be used for significant potentially life-threatening emergencies.  The emergency department is expensive, can often have long wait times for less significant concerns, so try to utilize primary care, urgent care, or telemedicine when possible to avoid unnecessary trips to the emergency department.  Virtual visits and telemedicine have been introduced since the pandemic started in 2020, and can be convenient ways to receive medical care.  We offer virtual appointments as well to assist you in a variety of options to seek medical care.   Separate significant issues discussed: Recently had COVID infection last week.  Overall improving.  Has some residual congestion left.  Continue over-the-counter remedies as needed and good rest and hydration.  We discussed possible complications of COVID.  Problem List Items Addressed This Visit     Prophylactic measure    He continues on PrEP medication, Truvada.  Continue condom use and preventative measures.      Relevant Orders   RPR+HIV+GC+CT Panel   Hepatitis C antibody   Hepatitis B surface antigen   High risk sexual behavior   High risk medication use   Encounter for health maintenance examination in adult - Primary   Relevant Orders   Comprehensive metabolic panel   CBC with Differential/Platelet   Lipid panel   RPR+HIV+GC+CT Panel   Hepatitis C antibody   Hepatitis B surface antigen   TSH   Chronic migraine w/o aura, not intractable, w/o stat migr    He recently established with neurology.  Currently using preventative and abortive medication to help control chronic headaches      Other Visit Diagnoses     Screening for lipid disorders       Relevant Orders   Lipid panel   History of COVID-19       Screen for STD  (sexually transmitted disease)       Relevant Orders   RPR+HIV+GC+CT Panel   Hepatitis C antibody   Hepatitis B surface antigen        Follow-up pending labs, yearly for physical

## 2023-02-25 NOTE — Assessment & Plan Note (Signed)
He recently established with neurology.  Currently using preventative and abortive medication to help control chronic headaches

## 2023-02-25 NOTE — Assessment & Plan Note (Signed)
He continues on PrEP medication, Truvada.  Continue condom use and preventative measures.

## 2023-02-27 ENCOUNTER — Other Ambulatory Visit: Payer: Self-pay | Admitting: Medical

## 2023-02-27 DIAGNOSIS — R7989 Other specified abnormal findings of blood chemistry: Secondary | ICD-10-CM

## 2023-02-27 DIAGNOSIS — R748 Abnormal levels of other serum enzymes: Secondary | ICD-10-CM

## 2023-02-27 MED ORDER — EMTRICITABINE-TENOFOVIR DF 200-300 MG PO TABS: 1.0000 | ORAL_TABLET | Freq: Every day | ORAL | 1 refills | Status: DC

## 2023-02-27 NOTE — Progress Notes (Signed)
Results sent through MyChart

## 2023-03-27 ENCOUNTER — Other Ambulatory Visit: Payer: Self-pay | Admitting: Medical

## 2023-03-27 NOTE — Telephone Encounter (Signed)
Spoke to patient and he said another medication was called in but he was to finish taking what he had left of the carbamazpeie then switch to imitreix

## 2023-08-15 ENCOUNTER — Other Ambulatory Visit: Payer: Self-pay | Admitting: Medical

## 2023-11-14 ENCOUNTER — Other Ambulatory Visit: Payer: Self-pay | Admitting: Medical

## 2023-11-14 NOTE — Telephone Encounter (Signed)
 Appt scheduled-pt has enough meds to last til appt-lm

## 2023-11-18 ENCOUNTER — Ambulatory Visit: Admitting: Medical

## 2023-11-18 VITALS — BP 106/66 | HR 70 | Wt 157.8 lb

## 2023-11-18 DIAGNOSIS — Z79899 Other long term (current) drug therapy: Secondary | ICD-10-CM

## 2023-11-18 DIAGNOSIS — Z299 Encounter for prophylactic measures, unspecified: Secondary | ICD-10-CM

## 2023-11-18 DIAGNOSIS — Z113 Encounter for screening for infections with a predominantly sexual mode of transmission: Secondary | ICD-10-CM

## 2023-11-18 DIAGNOSIS — R7989 Other specified abnormal findings of blood chemistry: Secondary | ICD-10-CM

## 2023-11-18 DIAGNOSIS — R748 Abnormal levels of other serum enzymes: Secondary | ICD-10-CM

## 2023-11-18 MED ORDER — EMTRICITABINE-TENOFOVIR DF 200-300 MG PO TABS: 1.0000 | ORAL_TABLET | Freq: Every day | ORAL | 0 refills | Status: DC

## 2023-11-18 NOTE — Progress Notes (Signed)
 Subjective:  Wesley Joseph is a 33 y.o. male who presents for Chief Complaint  Patient presents with   Medical Management of Chronic Issues    Med check for refills     Here for med management.  He is compliant with Truvada..  No recent sexual contacts or new partners.  No symptoms of concern.  Here for routine labs.  No side effects of medication.  He has history of migraines.  He does see neurology.  On average getting about 1 migraine per week.  He has Nurtec or Imitrex  to use as needed.  No recent issues.  Exercising some.  No other aggravating or relieving factors.    No other c/o.  Past Medical History:  Diagnosis Date   Eczema    History of condyloma acuminatum    Urethral lesion 09/2020   and prostatic lesion   Wears glasses    Current Outpatient Medications on File Prior to Visit  Medication Sig Dispense Refill   Multiple Vitamins-Minerals (MULTIVITAMIN MEN PO) Take by mouth daily.     Rimegepant Sulfate (NURTEC) 75 MG TBDP Take 1 tablet (75 mg total) by mouth daily as needed. 8 tablet 2   SUMAtriptan  (IMITREX ) 50 MG tablet May repeat in 2 hours if headache persists or recurs. 10 tablet 6   triamcinolone cream (KENALOG) 0.1 % Apply 1 application  topically 2 (two) times daily as needed (ACNE).     No current facility-administered medications on file prior to visit.     The following portions of the patient's history were reviewed and updated as appropriate: allergies, current medications, past family history, past medical history, past social history, past surgical history and problem list.  ROS Otherwise as in subjective above     Objective: BP 106/66   Pulse 70   Wt 157 lb 12.8 oz (71.6 kg)   BMI 23.64 kg/m   General appearance: alert, no distress, well developed, well nourished Neck: supple, no lymphadenopathy, no thyromegaly, no masses Heart: RRR, normal S1, S2, no murmurs Lungs: CTA bilaterally, no wheezes, rhonchi, or rales Pulses: 2+ radial  pulses, 2+ pedal pulses, normal cap refill Ext: no edema     Assessment: Encounter Diagnoses  Name Primary?   Prophylactic measure Yes   High risk medication use    Alkaline phosphatase elevation    Elevated LFTs    Screen for STD (sexually transmitted disease)      Plan: Prophylactic measures, screen for STD Labs today, continue Truvada prep daily, used condoms, continue routine labs at least HIV test every 3 months  Elevated alkaline phosphatase on prior labs - updated labs today.   Take a vitamin D supplement such as 2000 units daily if not taking vitamin D supplement   Elevated lfts - updated labs today   Deyvion was seen today for medical management of chronic issues.  Diagnoses and all orders for this visit:  Prophylactic measure -     RPR+HIV+GC+CT Panel  High risk medication use -     RPR+HIV+GC+CT Panel  Alkaline phosphatase elevation -     Comprehensive metabolic panel with GFR  Elevated LFTs -     Comprehensive metabolic panel with GFR  Screen for STD (sexually transmitted disease) -     RPR+HIV+GC+CT Panel  Other orders -     emtricitabine -tenofovir  (TRUVADA) 200-300 MG tablet; Take 1 tablet by mouth daily.    Follow up: pending labs

## 2023-11-20 ENCOUNTER — Other Ambulatory Visit: Payer: Self-pay | Admitting: Medical

## 2023-11-20 LAB — COMPREHENSIVE METABOLIC PANEL WITH GFR
ALT: 31 IU/L (ref 0–44)
AST: 30 IU/L (ref 0–40)
Albumin: 5.2 g/dL — ABNORMAL HIGH (ref 4.1–5.1)
Alkaline Phosphatase: 130 IU/L — ABNORMAL HIGH (ref 44–121)
BUN/Creatinine Ratio: 12 (ref 9–20)
BUN: 11 mg/dL (ref 6–20)
Bilirubin Total: 1 mg/dL (ref 0.0–1.2)
CO2: 23 mmol/L (ref 20–29)
Calcium: 9.8 mg/dL (ref 8.7–10.2)
Chloride: 103 mmol/L (ref 96–106)
Creatinine, Ser: 0.95 mg/dL (ref 0.76–1.27)
Globulin, Total: 2 g/dL (ref 1.5–4.5)
Glucose: 79 mg/dL (ref 70–99)
Potassium: 4.7 mmol/L (ref 3.5–5.2)
Sodium: 142 mmol/L (ref 134–144)
Total Protein: 7.2 g/dL (ref 6.0–8.5)
eGFR: 109 mL/min/{1.73_m2} (ref >59)

## 2023-11-20 LAB — RPR+HIV+GC+CT PANEL: RPR Ser Ql: NONREACTIVE

## 2023-11-20 MED ORDER — VITAMIN D (ERGOCALCIFEROL) 1.25 MG (50000 UNIT) PO CAPS: 50000.0000 [IU] | ORAL_CAPSULE | ORAL | 3 refills | Status: AC

## 2023-11-20 NOTE — Progress Notes (Signed)
 Results sent through MyChart

## 2024-02-11 ENCOUNTER — Ambulatory Visit: Payer: Self-pay

## 2024-02-11 ENCOUNTER — Ambulatory Visit (HOSPITAL_COMMUNITY)
Admission: EM | Admit: 2024-02-11 | Discharge: 2024-02-11 | Disposition: A | Discharge status: Home / Self Care | Attending: Internal Medicine | Admitting: Internal Medicine

## 2024-02-11 ENCOUNTER — Encounter (HOSPITAL_COMMUNITY): Payer: Self-pay | Admitting: Emergency Medicine

## 2024-02-11 DIAGNOSIS — Z209 Contact with and (suspected) exposure to unspecified communicable disease: Secondary | ICD-10-CM

## 2024-02-11 DIAGNOSIS — Z203 Contact with and (suspected) exposure to rabies: Secondary | ICD-10-CM | POA: Diagnosis not present

## 2024-02-11 MED ORDER — RABIES VACCINE, PCEC IM SUSR
1.0000 mL | Freq: Once | INTRAMUSCULAR | Status: AC
  Administered 2024-02-11: 1 mL via INTRAMUSCULAR

## 2024-02-11 MED ORDER — RABIES VACCINE, PCEC IM SUSR
INTRAMUSCULAR | Status: AC
  Filled 2024-02-11: qty 1

## 2024-02-11 NOTE — Discharge Instructions (Signed)
 We gave you a rabies vaccine  today.  Return on Friday (in 3 days) to have your second and final rabies vaccination. You have already received the rabies vaccine  series in the past in 2023, you are only required to have 2 rabies shots for this series.  If you develop any new or worsening symptoms or if your symptoms do not start to improve, please return here or follow-up with your primary care provider. If your symptoms are severe, please go to the emergency room.

## 2024-02-11 NOTE — ED Triage Notes (Signed)
 Pt had a bat fall on his head today when fell off an umbrella. Pt was told by triage nurse that needed to be seen for possible rabies exposure. Pt reports that he got rabies vaccinations in 2023.

## 2024-02-11 NOTE — ED Provider Notes (Signed)
 [facility]    CSN: [id number] Arrival date & time: 02/11/24  1200      History   Chief Complaint Chief Complaint  Patient presents with   bat exposure    HPI Wesley Joseph is a 33 y.o. male.   Wesley Joseph is a 33 y.o. male presenting for chief complaint of bat exposure. He was opening an umbrella outside of his house when a bad flew out from underneath its nest under the umbrella and landed on patient's head. He believes the bad touched his head momentarily then flew away. No open wounds to the head/scalp. He received rabies immunization series for post exposure prophylaxis in 2023. Currently asymptomatic.      Past Medical History:  Diagnosis Date   Eczema    History of condyloma acuminatum    Urethral lesion 09/2020   and prostatic lesion   Wears glasses     Patient Active Problem List   Diagnosis Date Noted   Chronic migraine w/o aura, not intractable, w/o stat migr 02/19/2023   Encounter for health maintenance examination in adult 01/26/2020   Vaccine counseling 01/26/2020   High risk medication use 04/14/2018   Prophylactic measure 03/20/2017   High risk sexual behavior 03/20/2017    Past Surgical History:  Procedure Laterality Date   CYSTOSCOPY WITH BIOPSY N/A 10/07/2020   Procedure: CYSTOSCOPY WITH BIOPSY AND FULGURATION;  Surgeon: Watt Rush, MD;  Location: [facility];  Service: Urology;  Laterality: N/A;   HERNIA REPAIR  1992 (infant)   per pt possible right inguinal    WART FULGURATION N/A 02/07/2015   Procedure: FULGURATION ANAL CONDYLOMA;  Surgeon: Oneil Budge, MD;  Location: [facility];  Service: General;  Laterality: N/A;       Home Medications    Prior to Admission medications   Medication Sig Start Date End Date Taking? Authorizing Provider  emtricitabine -tenofovir  (TRUVADA) 200-300 MG tablet Take 1 tablet by mouth daily. 11/18/23   Tysinger, Alm RAMAN, PA-C  Multiple Vitamins-Minerals (MULTIVITAMIN MEN PO) Take by  mouth daily.    [provider]  Rimegepant Sulfate (NURTEC) 75 MG TBDP Take 1 tablet (75 mg total) by mouth daily as needed. 01/22/23   Tysinger, Alm RAMAN, PA-C  SUMAtriptan  (IMITREX ) 50 MG tablet May repeat in 2 hours if headache persists or recurs. 02/19/23   Onita Duos, MD  triamcinolone cream (KENALOG) 0.1 % Apply 1 application  topically 2 (two) times daily as needed (ACNE).    [provider]  Vitamin D , Ergocalciferol , (DRISDOL ) 1.25 MG (50000 UNIT) CAPS capsule Take 1 capsule (50,000 Units total) by mouth every 7 (seven) days. 11/20/23   Tysinger, Alm RAMAN, PA-C    Family History Family History  Problem Relation Age of Onset   Hypertension Mother    Alzheimer's disease Maternal Grandmother    Diabetes Maternal Grandfather    Diabetes Paternal Grandfather    Heart disease Neg Hx    Cancer Neg Hx    Stroke Neg Hx     Social History Social History   Tobacco Use   Smoking status: Former    Types: Cigarettes   Smokeless tobacco: Never  Vaping Use   Vaping status: Never Used  Substance Use Topics   Alcohol use: Yes    Alcohol/week: 1.0 standard drink of alcohol    Types: 1 Standard drinks or equivalent per week    Comment: rarely   Drug use: Not Currently    Comment: 10-05-2020  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   Review of Systems Review of Systems Per HPI  Physical Exam Triage Vital Signs ED Triage Vitals  Encounter Vitals Group     BP 02/11/24 1232 122/80     Girls Systolic BP Percentile --      Girls Diastolic BP Percentile --      Boys Systolic BP Percentile --      Boys Diastolic BP Percentile --      Pulse Rate 02/11/24 1232 72     Resp 02/11/24 1232 14     Temp 02/11/24 1232 98.2 F (36.8 C)     Temp Source 02/11/24 1232 Oral     SpO2 02/11/24 1232 97 %     Weight --      Height --      Head Circumference --      Peak Flow --      Pain Score 02/11/24 1231 0     Pain Loc --      Pain  Education --      Exclude from Growth Chart --    No data found.  Updated Vital Signs BP 122/80 (BP Location: Left Arm)   Pulse 72   Temp 98.2 F (36.8 C) (Oral)   Resp 14   SpO2 97%   Visual Acuity Right Eye Distance:   Left Eye Distance:   Bilateral Distance:    Right Eye Near:   Left Eye Near:    Bilateral Near:     Physical Exam Vitals and nursing note reviewed.  Constitutional:      Appearance: He is not ill-appearing or toxic-appearing.  HENT:     Head: Normocephalic and atraumatic.     Right Ear: Hearing and external ear normal.     Left Ear: Hearing and external ear normal.     Nose: Nose normal.     Mouth/Throat:     Lips: Pink.  Eyes:     General: Lids are normal. Vision grossly intact. Gaze aligned appropriately.     Extraocular Movements: Extraocular movements intact.     Conjunctiva/sclera: Conjunctivae normal.  Pulmonary:     Effort: Pulmonary effort is normal.  Musculoskeletal:     Cervical back: Neck supple.  Skin:    General: Skin is warm and dry.     Capillary Refill: Capillary refill takes less than 2 seconds.     Findings: No rash.  Neurological:     General: No focal deficit present.     Mental Status: He is alert and oriented to person, place, and time. Mental status is at baseline.     Cranial Nerves: No dysarthria or facial asymmetry.  Psychiatric:        Mood and Affect: Mood normal.        Speech: Speech normal.        Behavior: Behavior normal.        Thought Content: Thought content normal.        Judgment: Judgment normal.      UC Treatments / Results  Labs (all labs ordered are listed, but only abnormal results are displayed) Labs Reviewed - No data to display  EKG   Radiology No results found.  Procedures Procedures (including critical care time)  Medications Ordered in UC Medications  rabies vaccine  (RABAVERT ) injection 1 mL (1 mL Intramuscular Given 02/11/24 1315)    Initial Impression / Assessment and Plan  /  UC Course  I have reviewed the triage vital signs and the nursing notes.  Pertinent labs & imaging results that were available during my care of the patient were reviewed by me and considered in my medical decision making (see chart for details).   1. Exposure to bat without known bite Patient has already had post exposure prophylaxis against rabies within the last 2 years (2023).  Therefore, CDC guidelines recommend 2 immunizations of RabAvert  on days 0 and 3.  Rabies immunoglobulin is not recommended to be administered again.  Rabies vaccine  given today (day 0), day 3 will be Friday February 14, 2024.  Discussed signs/symptoms of concern/urgent care and ER return precautions.   Counseled patient on potential for adverse effects with medications prescribed/recommended today, strict ER and return-to-clinic precautions discussed, patient verbalized understanding.    Final Clinical Impressions(s) / UC Diagnoses   Final diagnoses:  Exposure to bat without known bite     Discharge Instructions      We gave you a rabies vaccine  today.  Return on Friday (in 3 days) to have your second and final rabies vaccination. You have already received the rabies vaccine  series in the past in 2023, you are only required to have 2 rabies shots for this series.  If you develop any new or worsening symptoms or if your symptoms do not start to improve, please return here or follow-up with your primary care provider. If your symptoms are severe, please go to the emergency room.   ED Prescriptions   None    PDMP not reviewed this encounter.   Enedelia Dorna HERO, [place] 02/11/24 1355

## 2024-02-11 NOTE — Telephone Encounter (Signed)
 FYI Only or Action Required?: FYI only for provider.  Patient was last seen in primary care on 11/18/2023 by Bulah Alm RAMAN, PA-C.  Called Nurse Triage reporting Bat landed on head.  Symptoms began today.  Interventions attempted: Nothing.  Symptoms are: stable.  Triage Disposition: See Physician Within 24 Hours-recommended to be evaluated today at [facility] or ED due to contact with a bat  Patient/caregiver understands and will follow disposition?: Yes  Copied from CRM #[id number]. Topic: Clinical - Red Word Triage >> Feb 11, 2024 10:27 AM Wesley Joseph wrote: [facility] that prompted transfer to Nurse Triage: Patient states just now a bat landed on his head under an umbrella. Encounter happened so quickly not sure it he was bit or any blood drawn since his hair is longer and he cannot see it. Reason for Disposition  [1] No bite mark or scratch AND [2] suspected bat exposure (e.g., bat found in same room as sleeping adult)  Answer Assessment - Initial Assessment Questions 1. APPEARANCE What does it look like?  (e.g., abrasion, bruise, puncture)      Unsure if bat bit his head 2. SIZE: How big is the bite? (e.g., inches, cm; or compare to size of coin, pea, grape, ping pong ball)      No bit that the patient is aware of 3. LOCATION: Where is the bite located?      Top of head 4. ONSET: When did the bite happen? (e.g., minutes, hours ago)      Prior to call 5. ANIMAL: What type of animal caused the bite? Is the injury from a bite or a claw? If the animal is a dog or a cat, ask: Was it a pet or a stray? Was it acting ill or behaving strangely?     bat 6. RABIES VACCINE : For dog or cat bites, ask: Do you know if the pet is vaccinated against rabies?  (e.g., yes, no, overdue for rabies shot, unknown)     N/A- but patient received a rabies vaccine  a year ago due to having been bit by a dog.  7. CIRCUMSTANCES: Tell me how this happened.      Was opening an umbrella and a bat flew  out.  8. TETANUS: When was your last tetanus booster?     Patient can't remember.  Protocols used: Animal Bite-A-AH

## 2024-02-14 ENCOUNTER — Ambulatory Visit (HOSPITAL_COMMUNITY)
Admission: EM | Admit: 2024-02-14 | Discharge: 2024-02-14 | Disposition: A | Discharge status: Home / Self Care | Attending: Family Medicine | Admitting: Family Medicine

## 2024-02-14 DIAGNOSIS — Z23 Encounter for immunization: Secondary | ICD-10-CM

## 2024-02-14 DIAGNOSIS — Z203 Contact with and (suspected) exposure to rabies: Secondary | ICD-10-CM

## 2024-02-14 MED ORDER — RABIES VACCINE, PCEC IM SUSR
INTRAMUSCULAR | Status: AC
  Filled 2024-02-14: qty 1

## 2024-02-14 MED ORDER — RABIES VACCINE, PCEC IM SUSR
1.0000 mL | Freq: Once | INTRAMUSCULAR | Status: AC
  Administered 2024-02-14: 1 mL via INTRAMUSCULAR

## 2024-02-14 NOTE — ED Triage Notes (Signed)
Pt presents for day 3 of rabies injection.

## 2024-02-16 ENCOUNTER — Other Ambulatory Visit: Payer: Self-pay | Admitting: Medical

## 2024-02-27 ENCOUNTER — Ambulatory Visit: Payer: 59 | Admitting: Medical

## 2024-02-27 ENCOUNTER — Encounter: Payer: Self-pay | Admitting: Medical

## 2024-02-27 VITALS — BP 110/70 | HR 83 | Ht 67.5 in | Wt 151.6 lb

## 2024-02-27 DIAGNOSIS — R748 Abnormal levels of other serum enzymes: Secondary | ICD-10-CM

## 2024-02-27 DIAGNOSIS — R9089 Other abnormal findings on diagnostic imaging of central nervous system: Secondary | ICD-10-CM

## 2024-02-27 DIAGNOSIS — Z7252 High risk homosexual behavior: Secondary | ICD-10-CM | POA: Diagnosis not present

## 2024-02-27 DIAGNOSIS — Z0000 Encounter for general adult medical examination without abnormal findings: Secondary | ICD-10-CM

## 2024-02-27 DIAGNOSIS — Z79899 Other long term (current) drug therapy: Secondary | ICD-10-CM

## 2024-02-27 DIAGNOSIS — Z13220 Encounter for screening for lipoid disorders: Secondary | ICD-10-CM

## 2024-02-27 DIAGNOSIS — Z299 Encounter for prophylactic measures, unspecified: Secondary | ICD-10-CM | POA: Diagnosis not present

## 2024-02-27 NOTE — Progress Notes (Signed)
 Subjective:   HPI  Wesley Joseph is a 33 y.o. male who presents for Chief Complaint  Patient presents with   Annual Exam    Fasting cpe, no concerns    Patient Care Team: Daylin Gruszka, Alm GORMAN RIGGERS as PCP - General (Family Medicine) Sees dentist Sees eye doctor Dr. Watt, [facility] Dr. Modena Callander, neurology   Concerns: Here for physical wellness today fasting.  Has migraines, 1-2 per week, sees neurology, uses nurtec or imitrex for acute therapy  He continues on Prep Truvada  He continues on vit D supplement   Reviewed their medical, surgical, family, social, medication, and allergy history and updated chart as appropriate.  Past Medical History:  Diagnosis Date   Eczema    History of condyloma acuminatum    Migraine    Prophylactic measure    PreP HIV prevention   Urethral lesion 09/2020   and prostatic lesion   Wears glasses     Past Surgical History:  Procedure Laterality Date   CYSTOSCOPY WITH BIOPSY N/A 10/07/2020   Procedure: CYSTOSCOPY WITH BIOPSY AND FULGURATION;  Surgeon: Watt Rush, MD;  Location: [facility];  Service: Urology;  Laterality: N/A;   HERNIA REPAIR  1992 (infant)   per pt possible right inguinal    WART FULGURATION N/A 02/07/2015   Procedure: FULGURATION ANAL CONDYLOMA;  Surgeon: Oneil Budge, MD;  Location: [facility];  Service: General;  Laterality: N/A;    Family History  Problem Relation Age of Onset   Hypertension Mother    Alzheimer's disease Maternal Grandmother    Diabetes Maternal Grandfather    Diabetes Paternal Grandfather    Heart disease Neg Hx    Cancer Neg Hx    Stroke Neg Hx      Current Outpatient Medications:    emtricitabine-tenofovir (TRUVADA) 200-300 MG tablet, TAKE 1 TABLET BY MOUTH DAILY, Disp: 90 tablet, Rfl: 0   Multiple Vitamins-Minerals (MULTIVITAMIN MEN PO), Take by mouth daily., Disp: , Rfl:    Rimegepant Sulfate (NURTEC) 75 MG TBDP, Take 1 tablet (75 mg total) by mouth daily as  needed., Disp: 8 tablet, Rfl: 2   SUMAtriptan (IMITREX) 50 MG tablet, May repeat in 2 hours if headache persists or recurs., Disp: 10 tablet, Rfl: 6   triamcinolone cream (KENALOG) 0.1 %, Apply 1 application  topically 2 (two) times daily as needed (ACNE)., Disp: , Rfl:    Vitamin D, Ergocalciferol, (DRISDOL) 1.25 MG (50000 UNIT) CAPS capsule, Take 1 capsule (50,000 Units total) by mouth every 7 (seven) days., Disp: 12 capsule, Rfl: 3  No Known Allergies      02/27/2024    9:48 AM 11/18/2023   11:36 AM 02/25/2023   10:24 AM 09/06/2022    8:42 AM 02/16/2022   10:35 AM  Depression screen PHQ 2/9  Decreased Interest 0 0 0 0 0  Down, Depressed, Hopeless 0 0 0 0 0  PHQ - 2 Score 0 0 0 0 0     Review of Systems  Constitutional:  Negative for chills, fever, malaise/fatigue and weight loss.  HENT:  Negative for congestion, ear pain, hearing loss, sore throat and tinnitus.   Eyes:  Negative for blurred vision, pain and redness.  Respiratory:  Negative for cough, hemoptysis and shortness of breath.   Cardiovascular:  Negative for chest pain, palpitations, orthopnea, claudication and leg swelling.  Gastrointestinal:  Negative for abdominal pain, blood in stool, constipation, diarrhea, nausea and vomiting.  Genitourinary:  Negative for dysuria, flank pain,  frequency, hematuria and urgency.  Musculoskeletal:  Negative for falls, joint pain and myalgias.  Skin:  Negative for itching and rash.  Neurological:  Positive for headaches. Negative for dizziness, tingling, speech change and weakness.  Endo/Heme/Allergies:  Negative for polydipsia. Does not bruise/bleed easily.  Psychiatric/Behavioral:  Negative for depression and memory loss. The patient is not nervous/anxious and does not have insomnia.         Objective:  BP 110/70   Pulse 83   Ht 5' 7.5 (1.715 m)   Wt 151 lb 9.6 oz (68.8 kg)   SpO2 97%   BMI 23.39 kg/m    General appearance: alert, no distress, WD/WN, Caucasian male Skin:  unremarkable HEENT: normocephalic, conjunctiva/corneas normal, sclerae anicteric, PERRLA, EOMi, nares patent, no discharge or erythema, pharynx normal Neck: supple, no lymphadenopathy, no thyromegaly, no masses, normal ROM, no bruits Chest: non tender, normal shape and expansion Heart: RRR, normal S1, S2, no murmurs Lungs: CTA bilaterally, no wheezes, rhonchi, or rales Abdomen: +bs, soft, non tender, non distended, no masses, no hepatomegaly, no splenomegaly, no bruits Back: non tender, normal ROM, no scoliosis Musculoskeletal: upper extremities non tender, no obvious deformity, normal ROM throughout, lower extremities non tender, no obvious deformity, normal ROM throughout Extremities: no edema, no cyanosis, no clubbing Pulses: 2+ symmetric, upper and lower extremities, normal cap refill Neurological: alert, oriented x 3, CN2-12 intact, strength normal upper extremities and lower extremities, sensation normal throughout, DTRs 2+ throughout, no cerebellar signs, gait normal Psychiatric: normal affect, behavior normal, pleasant  GU: deferred Rectal: deferred   Assessment and Plan :   Encounter Diagnoses  Name Primary?   Encounter for health maintenance examination in adult Yes   Prophylactic measure    High risk homosexual behavior    High risk medication use    Alkaline phosphatase elevation    Abnormal brain MRI    Screening for lipid disorders      This visit was a preventative care visit, also known as wellness visit or routine physical.   Topics typically include healthy lifestyle, diet, exercise, preventative care, vaccinations, sick and well care, proper use of emergency dept and after hours care, as well as other concerns.     Recommendations: Continue to return yearly for your annual wellness and preventative care visits.  This gives us  a chance to discuss healthy lifestyle, exercise, vaccinations, review your chart record, and perform screenings where appropriate.  I  recommend you see your eye doctor yearly for routine vision care.  I recommend you see your dentist yearly for routine dental care including hygiene visits twice yearly.   Vaccination recommendations were reviewed Immunization History  Administered Date(s) Administered   DTaP 02/02/1993, 10/01/1994, 02/25/1996   HPV 9-valent 12/17/2016, 03/20/2017, 07/24/2017   Hepatitis B 09/30/1996, 11/16/1996, 10/05/2003   IPV 02/02/1993, 02/25/1996, 09/30/1996   Influenza,inj,Quad PF,6+ Mos 03/20/2017, 04/14/2018, 04/27/2020   MMR 10/10/1992, 09/30/1996   PFIZER(Purple Top)SARS-COV-2 Vaccination 09/25/2019, 10/16/2019, 07/25/2020, 06/16/2021   PPD Test 09/17/2009   Pfizer(Comirnaty)Fall Seasonal Vaccine 12 years and older 09/06/2022   Rabies, IM 06/05/2022, 06/08/2022, 06/12/2022, 06/19/2022, 02/11/2024, 02/14/2024   Td 04/14/2016   Tdap 09/15/2009, 05/29/2022   Advised yearly flu shot   Screening for cancer: Colon cancer screening: Age 59  Testicular cancer screening You should do a monthly self testicular exam if you are between 15-74 years old  We discussed PSA, prostate exam, and prostate cancer screening risks/benefits.     Skin cancer screening: Check your skin regularly for new changes, growing  lesions, or other lesions of concern Come in for evaluation if you have skin lesions of concern.  Lung cancer screening: If you have a greater than 20 pack year history of tobacco use, then you may qualify for lung cancer screening with a chest CT scan.   Please call your insurance company to inquire about coverage for this test.  We currently don't have screenings for other cancers besides breast, cervical, colon, and lung cancers.  If you have a strong family history of cancer or have other cancer screening concerns, please let me know.    Bone health: Get at least 150 minutes of aerobic exercise weekly Get weight bearing exercise at least once weekly Bone density test:  A bone  density test is an imaging test that uses a type of X-ray to measure the amount of calcium and other minerals in your bones. The test may be used to diagnose or screen you for a condition that causes weak or thin bones (osteoporosis), predict your risk for a broken bone (fracture), or determine how well your osteoporosis treatment is working. The bone density test is recommended for females 65 and older, or females or males <65 if certain risk factors such as thyroid disease, long term use of steroids such as for asthma or rheumatological issues, vitamin D deficiency, estrogen deficiency, family history of osteoporosis, self or family history of fragility fracture in first degree relative.    Heart health: Get at least 150 minutes of aerobic exercise weekly Limit alcohol It is important to maintain a healthy blood pressure and healthy cholesterol numbers  Heart disease screening: Screening for heart disease includes screening for blood pressure, fasting lipids, glucose/diabetes screening, BMI height to weight ratio, reviewed of smoking status, physical activity, and diet.    Goals include blood pressure 120/80 or less, maintaining a healthy lipid/cholesterol profile, preventing diabetes or keeping diabetes numbers under good control, not smoking or using tobacco products, exercising most days per week or at least 150 minutes per week of exercise, and eating healthy variety of fruits and vegetables, healthy oils, and avoiding unhealthy food choices like fried food, fast food, high sugar and high cholesterol foods.    Other tests may possibly include EKG test, CT coronary calcium score, echocardiogram, exercise treadmill stress test.    Medical care options: I recommend you continue to seek care here first for routine care.  We try really hard to have available appointments Monday through Friday daytime hours for sick visits, acute visits, and physicals.  Urgent care should be used for after hours  and weekends for significant issues that cannot wait till the next day.  The emergency department should be used for significant potentially life-threatening emergencies.  The emergency department is expensive, can often have long wait times for less significant concerns, so try to utilize primary care, urgent care, or telemedicine when possible to avoid unnecessary trips to the emergency department.  Virtual visits and telemedicine have been introduced since the pandemic started in 2020, and can be convenient ways to receive medical care.  We offer virtual appointments as well to assist you in a variety of options to seek medical care.   Separate significant issues discussed: History alkaline phosphatase elevation activated last day, on vitamin D therapy which could be the cause of his elevated alkaline phosphatase  Abnormal brain MRI last year showing area of small vessel disease per neurology.  Updated cholesterol labs today.  Discussed possible treatment with statin for prevention.  Blood pressure is  normal  Migraines-we discussed preventative versus acute therapy.  Currently on abortive therapy only.  Sees neurology.  We discussed prophylaxis for HIV, routine STD screening, continue condom use.  Currently on truvada prep.   Wesley Joseph was seen today for annual exam.  Diagnoses and all orders for this visit:  Encounter for health maintenance examination in adult -     CBC -     Comprehensive metabolic panel with GFR -     Lipid panel -     RPR+HIV+GC+CT Panel -     VITAMIN D 25 Hydroxy (Vit-D Deficiency, Fractures)  Prophylactic measure -     RPR+HIV+GC+CT Panel  High risk homosexual behavior -     RPR+HIV+GC+CT Panel  High risk medication use  Alkaline phosphatase elevation -     VITAMIN D 25 Hydroxy (Vit-D Deficiency, Fractures)  Abnormal brain MRI  Screening for lipid disorders -     Lipid panel    Follow-up pending labs, yearly for physical

## 2024-02-28 ENCOUNTER — Other Ambulatory Visit: Payer: Self-pay | Admitting: Medical

## 2024-02-28 ENCOUNTER — Ambulatory Visit: Payer: Self-pay | Admitting: Medical

## 2024-02-28 NOTE — Progress Notes (Signed)
 Results through MyChart

## 2024-02-29 LAB — COMPREHENSIVE METABOLIC PANEL WITH GFR
ALT: 30 IU/L (ref 0–44)
AST: 39 IU/L (ref 0–40)
Albumin: 5 g/dL (ref 4.1–5.1)
Alkaline Phosphatase: 133 IU/L — ABNORMAL HIGH (ref 44–121)
BUN/Creatinine Ratio: 8 — ABNORMAL LOW (ref 9–20)
BUN: 8 mg/dL (ref 6–20)
Bilirubin Total: 1 mg/dL (ref 0.0–1.2)
CO2: 20 mmol/L (ref 20–29)
Calcium: 10.1 mg/dL (ref 8.7–10.2)
Chloride: 98 mmol/L (ref 96–106)
Creatinine, Ser: 1.02 mg/dL (ref 0.76–1.27)
Globulin, Total: 2.6 g/dL (ref 1.5–4.5)
Glucose: 93 mg/dL (ref 70–99)
Potassium: 4.8 mmol/L (ref 3.5–5.2)
Sodium: 137 mmol/L (ref 134–144)
Total Protein: 7.6 g/dL (ref 6.0–8.5)
eGFR: 100 mL/min/1.73 (ref >59)

## 2024-02-29 LAB — CBC
Hematocrit: 49.9 % (ref 37.5–51.0)
Hemoglobin: 17.4 g/dL (ref 13.0–17.7)
MCH: 32 pg (ref 26.6–33.0)
MCHC: 34.9 g/dL (ref 31.5–35.7)
MCV: 92 fL (ref 79–97)
Platelets: 190 x10E3/uL (ref 150–450)
RBC: 5.44 x10E6/uL (ref 4.14–5.80)
RDW: 12.1 % (ref 11.6–15.4)
WBC: 4.6 x10E3/uL (ref 3.4–10.8)

## 2024-02-29 LAB — LIPID PANEL
Chol/HDL Ratio: 4.1 ratio (ref 0.0–5.0)
Cholesterol, Total: 177 mg/dL (ref 100–199)
HDL: 43 mg/dL (ref >39)
LDL Chol Calc (NIH): 120 mg/dL — ABNORMAL HIGH (ref 0–99)
Triglycerides: 77 mg/dL (ref 0–149)
VLDL Cholesterol Cal: 14 mg/dL (ref 5–40)

## 2024-02-29 LAB — VITAMIN D 25 HYDROXY (VIT D DEFICIENCY, FRACTURES): Vit D, 25-Hydroxy: 81.3 ng/mL (ref 30.0–100.0)

## 2024-02-29 LAB — RPR+HIV+GC+CT PANEL
Chlamydia trachomatis, NAA: NEGATIVE
HIV Screen 4th Generation wRfx: NONREACTIVE
Neisseria Gonorrhoeae by PCR: NEGATIVE
RPR Ser Ql: NONREACTIVE

## 2024-03-02 ENCOUNTER — Other Ambulatory Visit: Payer: Self-pay | Admitting: Medical

## 2024-03-02 NOTE — Progress Notes (Signed)
 Results through MyChart

## 2024-03-20 ENCOUNTER — Other Ambulatory Visit: Payer: Self-pay | Admitting: Neurology

## 2024-05-21 ENCOUNTER — Other Ambulatory Visit: Payer: Self-pay | Admitting: Medical

## 2024-05-21 ENCOUNTER — Other Ambulatory Visit: Payer: Self-pay | Admitting: Internal Medicine

## 2024-05-21 DIAGNOSIS — Z7252 High risk homosexual behavior: Secondary | ICD-10-CM

## 2024-05-21 DIAGNOSIS — Z299 Encounter for prophylactic measures, unspecified: Secondary | ICD-10-CM

## 2024-05-21 DIAGNOSIS — Z79899 Other long term (current) drug therapy: Secondary | ICD-10-CM

## 2024-08-13 ENCOUNTER — Other Ambulatory Visit

## 2024-08-13 DIAGNOSIS — Z299 Encounter for prophylactic measures, unspecified: Secondary | ICD-10-CM

## 2024-08-13 DIAGNOSIS — Z7252 High risk homosexual behavior: Secondary | ICD-10-CM

## 2024-08-13 DIAGNOSIS — Z79899 Other long term (current) drug therapy: Secondary | ICD-10-CM

## 2024-08-14 LAB — RPR+HIV+GC+CT PANEL
Chlamydia trachomatis, NAA: NEGATIVE
HIV Screen 4th Generation wRfx: NONREACTIVE
Neisseria Gonorrhoeae by PCR: NEGATIVE
RPR Ser Ql: NONREACTIVE

## 2024-08-15 ENCOUNTER — Ambulatory Visit: Payer: Self-pay | Admitting: Medical

## 2024-08-15 ENCOUNTER — Other Ambulatory Visit: Payer: Self-pay | Admitting: Medical

## 2024-08-15 MED ORDER — EMTRICITABINE-TENOFOVIR DF 200-300 MG PO TABS: 1.0000 | ORAL_TABLET | Freq: Every day | ORAL | 0 refills | Status: AC

## 2024-08-15 NOTE — Progress Notes (Signed)
 Results thru my chart

## 2025-03-03 ENCOUNTER — Encounter: Payer: Self-pay | Admitting: Medical
